# Patient Record
Sex: Female | Born: 1975
Health system: Southern US, Community
[De-identification: ages and names within clinical notes are randomized; demographics above are authoritative.]

## PROBLEM LIST (undated history)

## (undated) DIAGNOSIS — F419 Anxiety disorder, unspecified: Secondary | ICD-10-CM

## (undated) DIAGNOSIS — I10 Essential (primary) hypertension: Secondary | ICD-10-CM

## (undated) DIAGNOSIS — F32A Depression, unspecified: Secondary | ICD-10-CM

## (undated) DIAGNOSIS — F329 Major depressive disorder, single episode, unspecified: Secondary | ICD-10-CM

## (undated) HISTORY — PX: OTHER SURGICAL HISTORY: SHX169

---

## 2007-05-03 ENCOUNTER — Emergency Department: Payer: Self-pay | Admitting: Emergency Medicine

## 2009-09-08 ENCOUNTER — Ambulatory Visit: Payer: Self-pay | Admitting: Family Medicine

## 2010-07-15 HISTORY — PX: TUBAL LIGATION: SHX77

## 2011-03-31 ENCOUNTER — Emergency Department: Payer: Self-pay | Admitting: *Deleted

## 2011-06-10 ENCOUNTER — Inpatient Hospital Stay: Payer: Self-pay | Admitting: Psychiatry

## 2011-06-19 IMAGING — CR DG TOE 5TH LEFT
1 series · 3 of 3 positions shown · non-contrast
Comparison: none

REASON FOR EXAM: injury
COMMENTS:

[Series 2: view not recorded · 0.17mm/px · 3 of 3 slices shown]
[im 1/3]
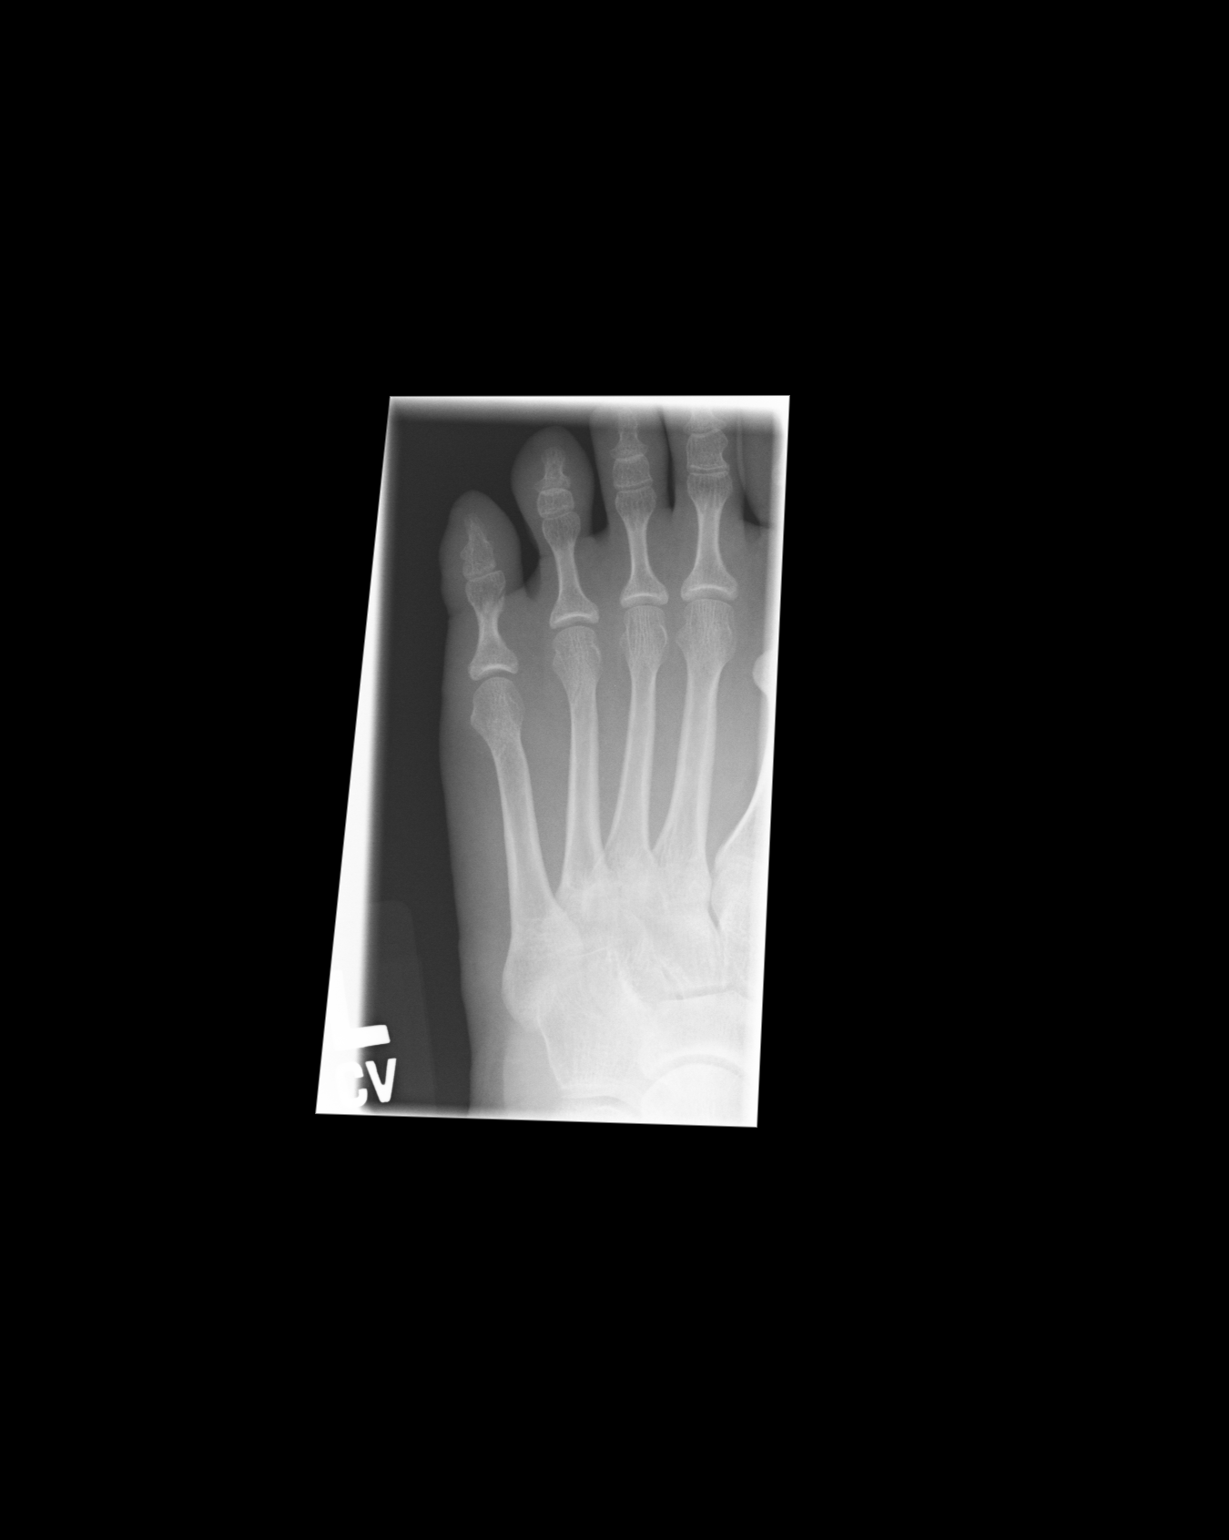
[im 2/3]
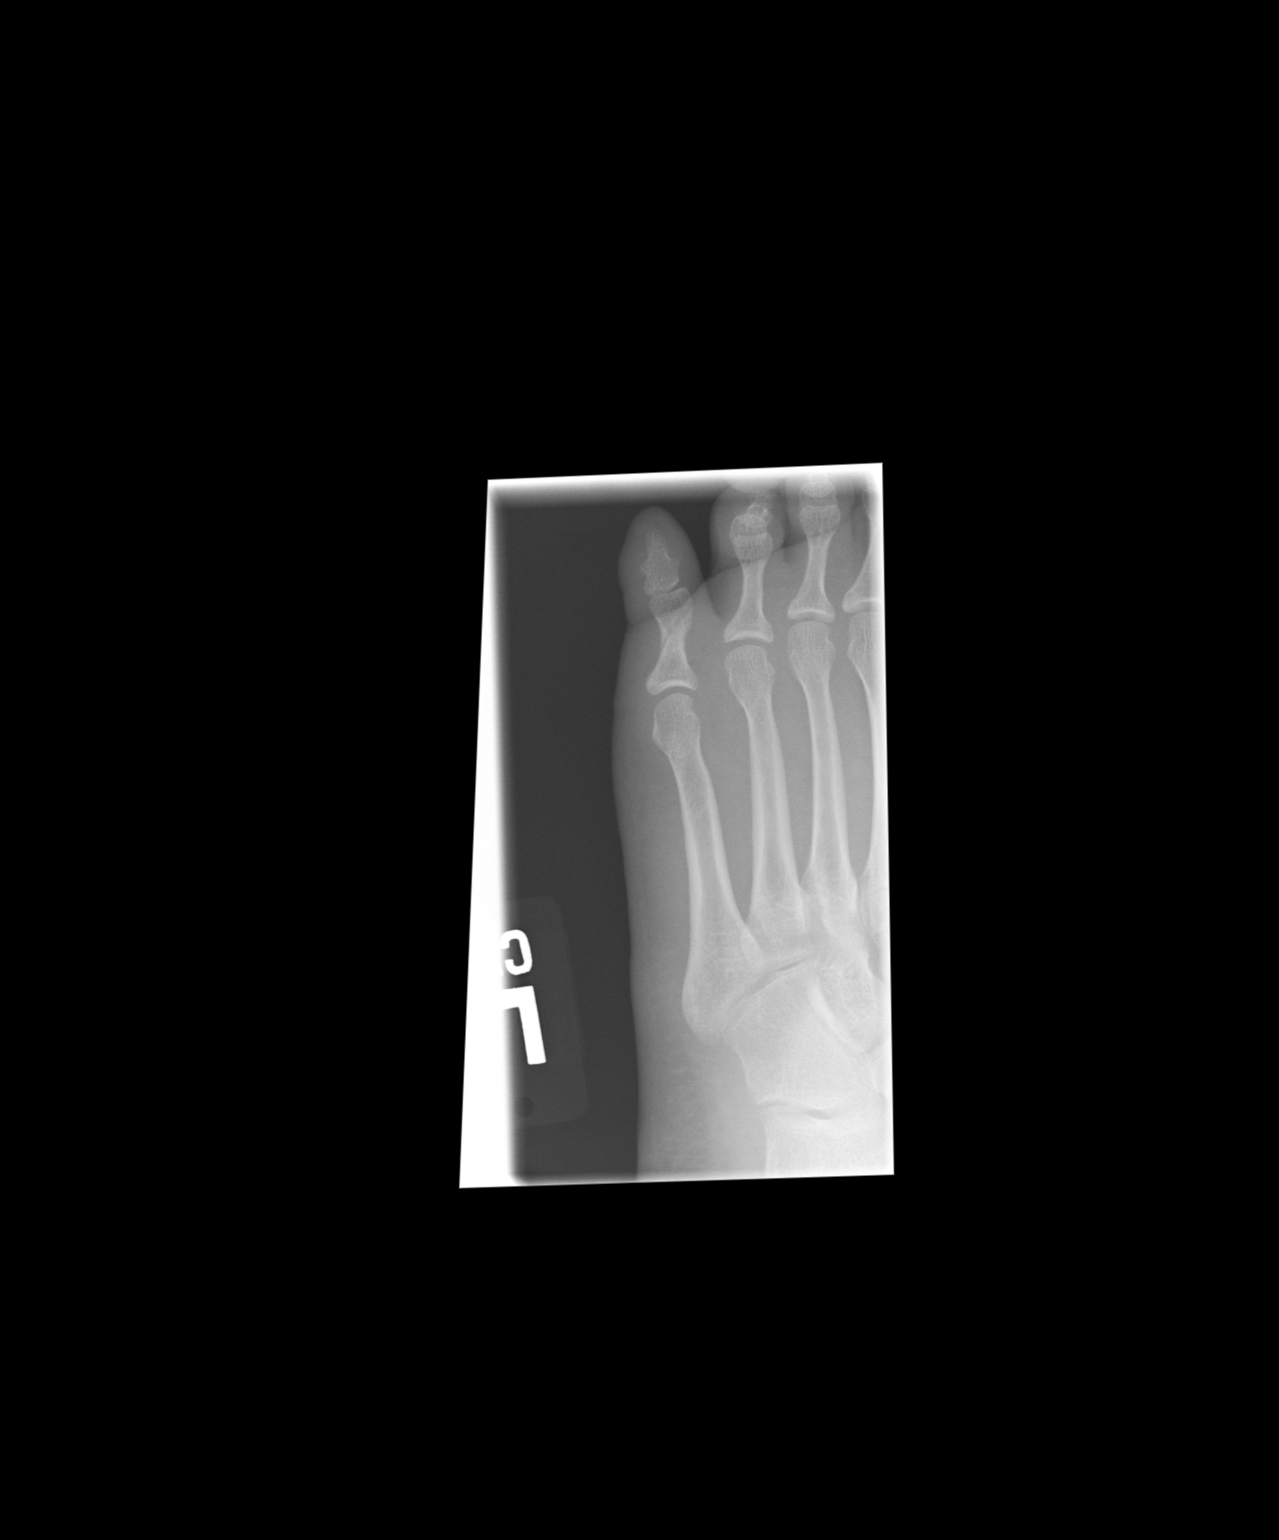
[im 3/3]
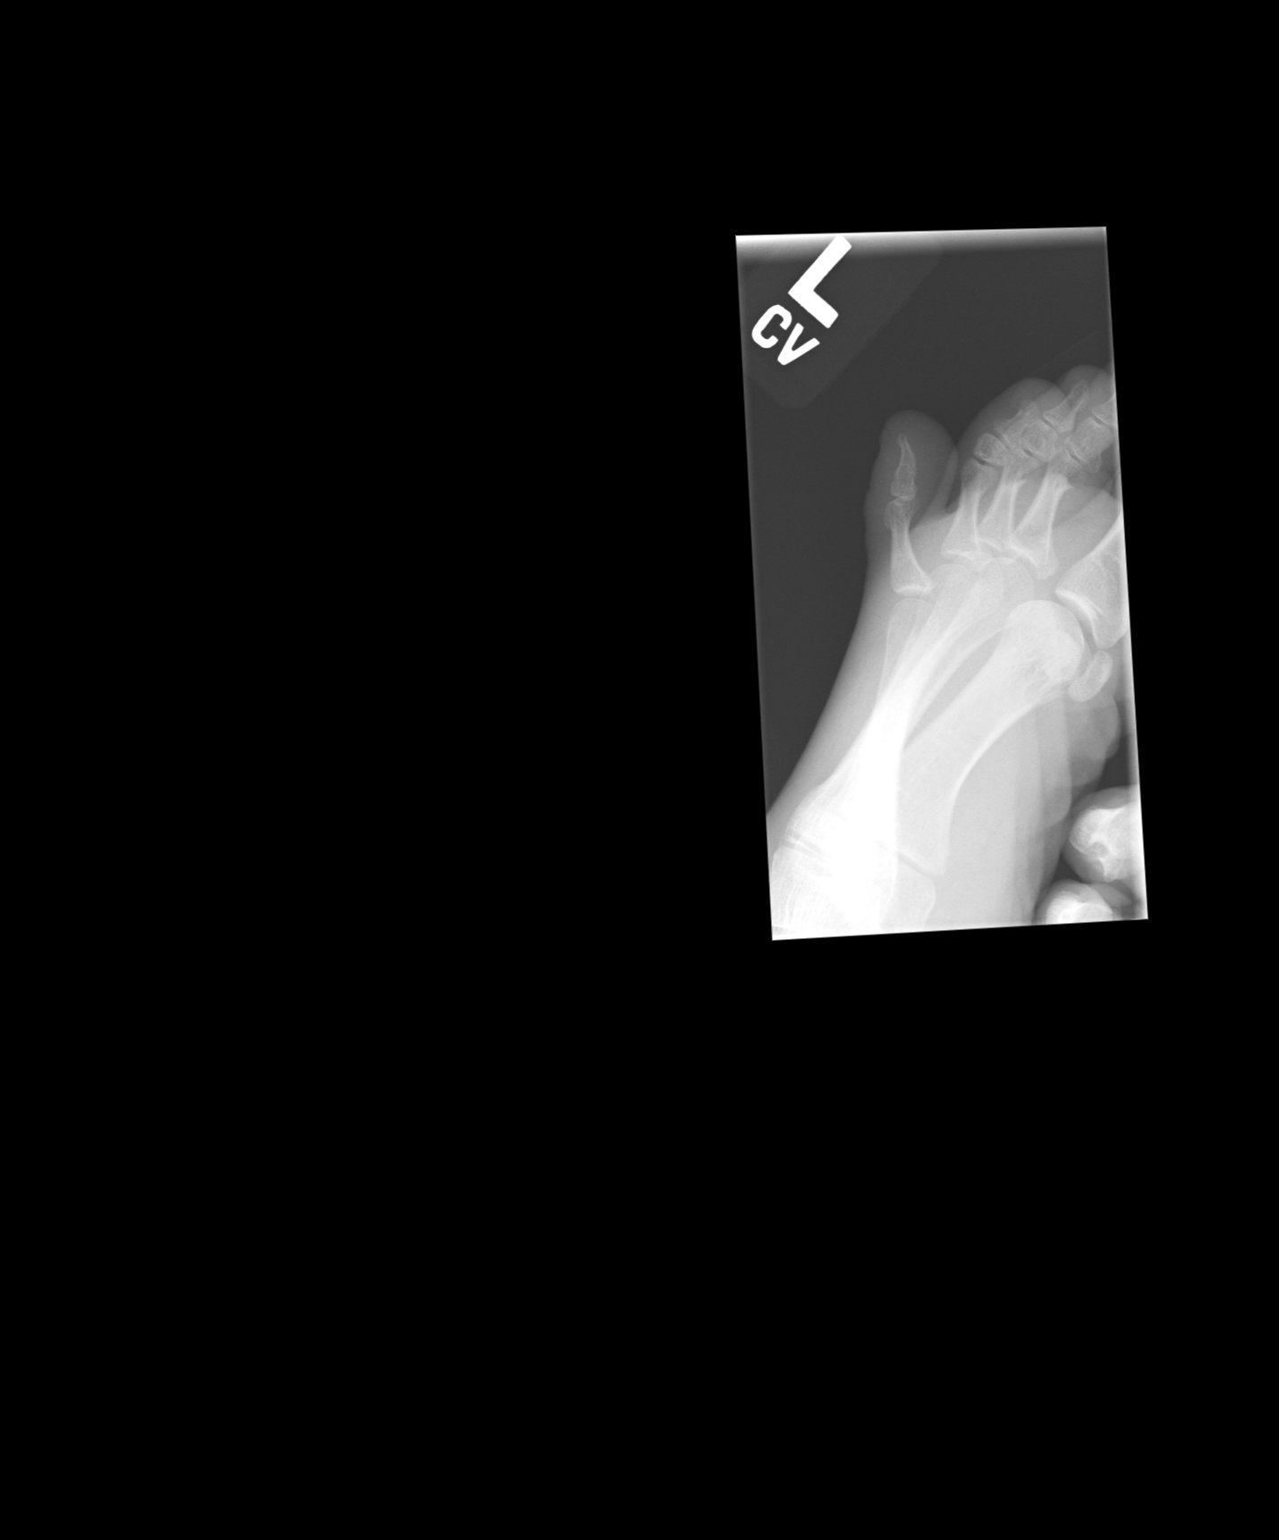

[3 of 3 positions shown; findings below may reference images not displayed]

PROCEDURE:     KDR - KDXR TOE 5TH DIGIT LEFT FOOT  - September 08, 2009  [DATE]

RESULT:     Three views of the fifth toe are submitted. There is a lucency
through the proximal phalanx that is obliquely oriented consistent with an
acute nondisplaced fracture. The distal phalanx is intact. The visualized
portions of the third and fourth digits appear normal. The fifth metatarsal
is intact.
IMPRESSION: The patient has sustained an acute spiral nondisplaced
fracture of the shaft of the proximal phalanx of the left great toe.

## 2011-08-02 ENCOUNTER — Ambulatory Visit: Payer: Self-pay | Admitting: Obstetrics and Gynecology

## 2011-08-02 LAB — HCG, QUANTITATIVE, PREGNANCY: Beta Hcg, Quant.: <1 m[IU]/mL — ABNORMAL LOW

## 2011-08-02 LAB — URINALYSIS, COMPLETE
Bacteria: NONE SEEN
Blood: NEGATIVE
Glucose,UR: NEGATIVE mg/dL (ref 0–75)
Leukocyte Esterase: NEGATIVE
Nitrite: NEGATIVE
Ph: 7 (ref 4.5–8.0)
RBC,UR: <1 /HPF (ref 0–5)

## 2011-08-02 LAB — HEMOGLOBIN: HGB: 13.5 g/dL (ref 12.0–16.0)

## 2011-08-09 ENCOUNTER — Ambulatory Visit: Payer: Self-pay | Admitting: Obstetrics and Gynecology

## 2011-08-09 LAB — PREGNANCY, URINE: Pregnancy Test, Urine: NEGATIVE m[IU]/mL

## 2012-06-04 ENCOUNTER — Emergency Department: Payer: Self-pay | Admitting: Emergency Medicine

## 2012-06-04 LAB — SALICYLATE LEVEL: Salicylates, Serum: 3.7 mg/dL — ABNORMAL HIGH

## 2012-06-04 LAB — TSH: Thyroid Stimulating Horm: 1.24 u[IU]/mL

## 2012-06-04 LAB — CBC
HCT: 43.5 % (ref 35.0–47.0)
HGB: 15 g/dL (ref 12.0–16.0)
MCV: 92 fL (ref 80–100)
RBC: 4.75 10*6/uL (ref 3.80–5.20)
WBC: 11.1 10*3/uL — ABNORMAL HIGH (ref 3.6–11.0)

## 2012-06-04 LAB — URINALYSIS, COMPLETE
Nitrite: NEGATIVE
Protein: 30
RBC,UR: 3 /HPF (ref 0–5)
WBC UR: 26 /HPF (ref 0–5)

## 2012-06-04 LAB — ACETAMINOPHEN LEVEL: Acetaminophen: <2 ug/mL

## 2012-06-04 LAB — DRUG SCREEN, URINE
Barbiturates, Ur Screen: NEGATIVE (ref ?–200)
Cannabinoid 50 Ng, Ur ~~LOC~~: POSITIVE (ref ?–50)
Cocaine Metabolite,Ur ~~LOC~~: POSITIVE (ref ?–300)
MDMA (Ecstasy)Ur Screen: POSITIVE (ref ?–500)
Opiate, Ur Screen: NEGATIVE (ref ?–300)

## 2012-06-04 LAB — COMPREHENSIVE METABOLIC PANEL
Anion Gap: 8 (ref 7–16)
Calcium, Total: 9.2 mg/dL (ref 8.5–10.1)
Chloride: 101 mmol/L (ref 98–107)
Co2: 26 mmol/L (ref 21–32)
EGFR (African American): >60
Potassium: 3.3 mmol/L — ABNORMAL LOW (ref 3.5–5.1)
SGOT(AST): 27 U/L (ref 15–37)

## 2012-06-04 LAB — ETHANOL
Ethanol %: <0.003 % (ref 0.000–0.080)
Ethanol: <3 mg/dL

## 2012-07-24 ENCOUNTER — Ambulatory Visit: Payer: Self-pay | Admitting: Family Medicine

## 2013-01-08 IMAGING — US US PELV - US TRANSVAGINAL
1 series · 17 of 25 positions shown · non-contrast
Comparison: none

REASON FOR EXAM: vaginal bleeding
COMMENTS:

PROCEDURE:     US  - US PELVIS EXAM W/TRANSVAGINAL  - March 31, 2011  [DATE]
RESULT:     Though ultrasound dated 03/31/2011.
Transabdominal and endovaginal imaging of the pelvis was obtained.

[Series 1: us pelv - us transvaginal · 17 of 77 slices shown]
[im 1/77]
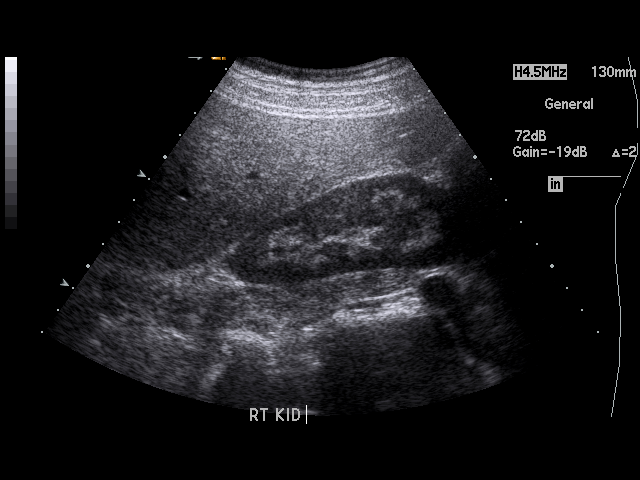
[im 7/77]
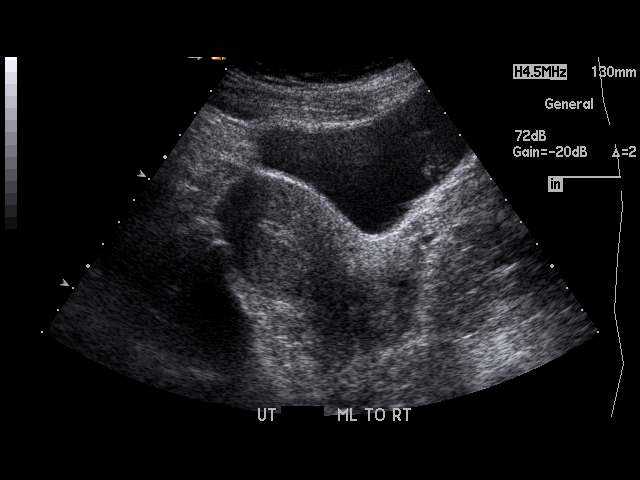
[im 10/77]
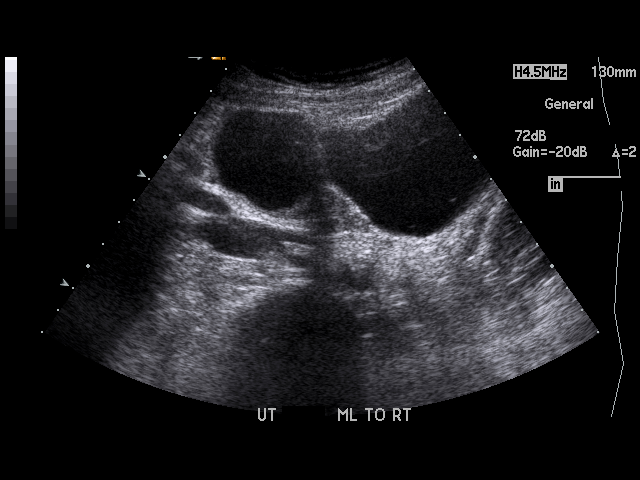
[im 16/77]
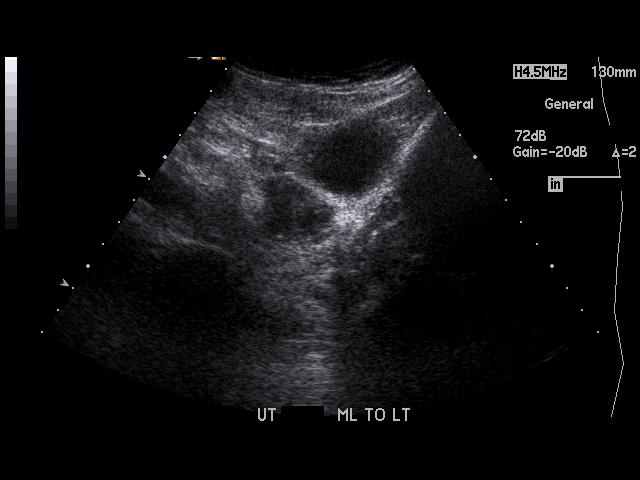
[im 20/77]
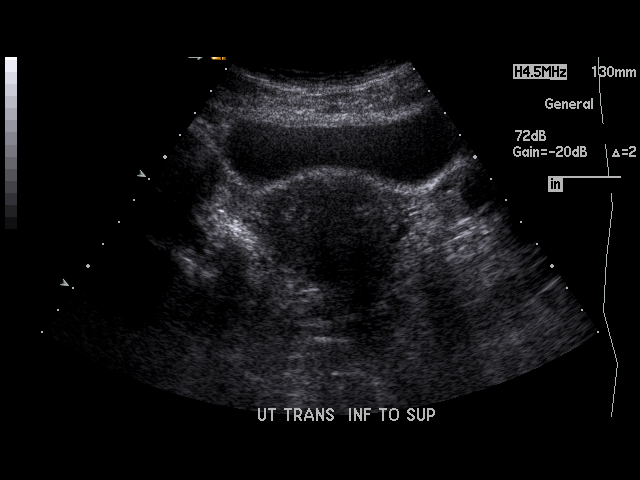
[im 26/77]
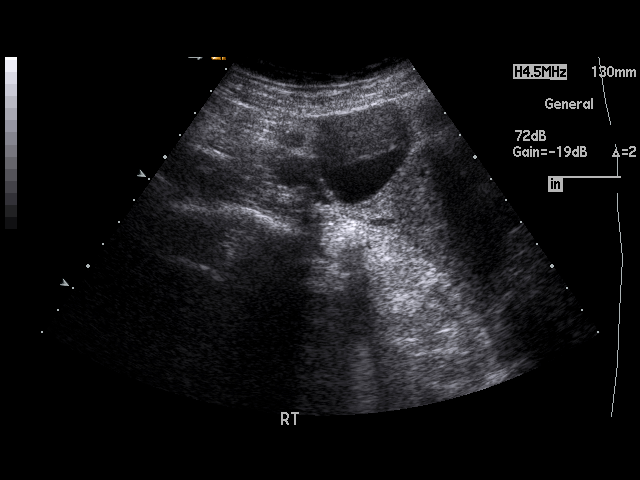
[im 29/77]
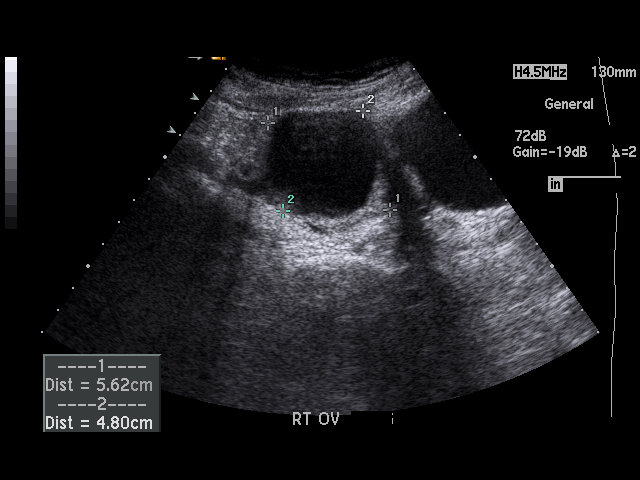
[im 35/77]
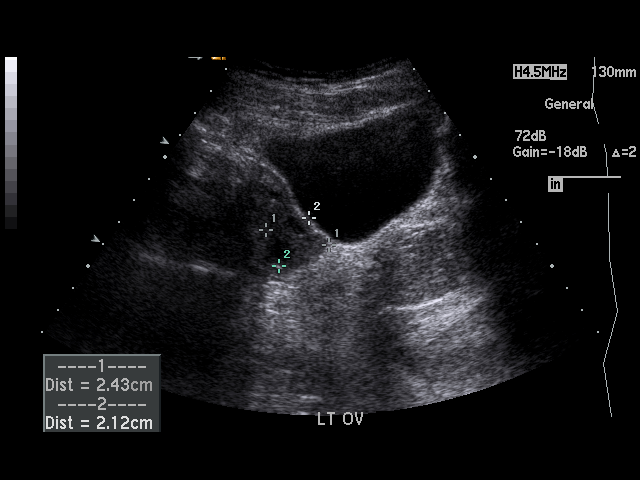
[im 39/77]
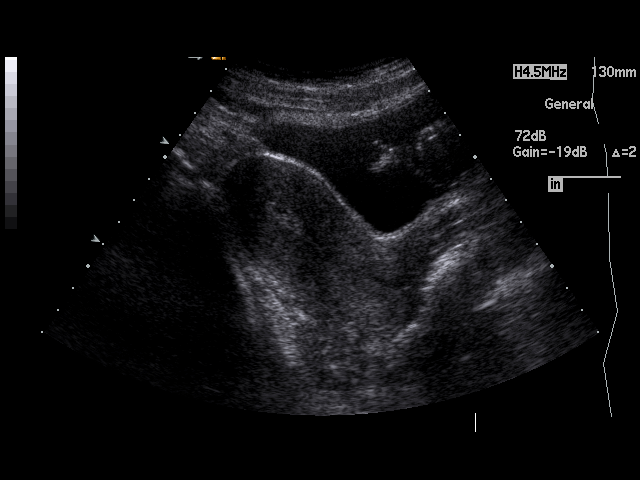
[im 42/77]
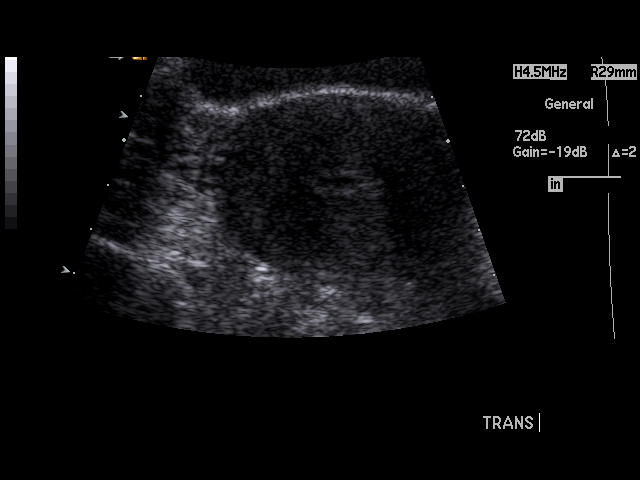
[im 48/77]
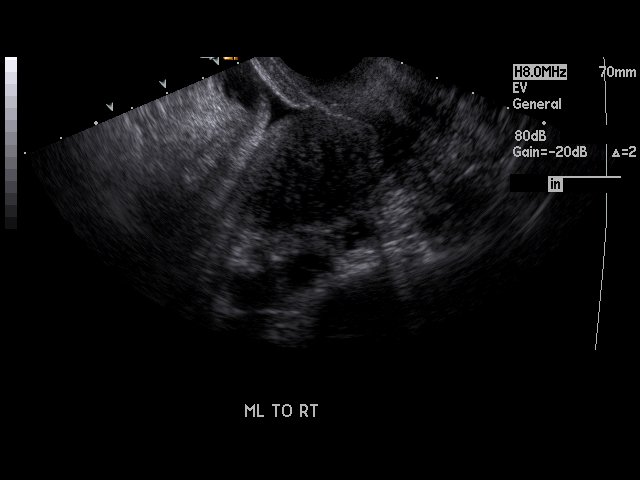
[im 51/77]
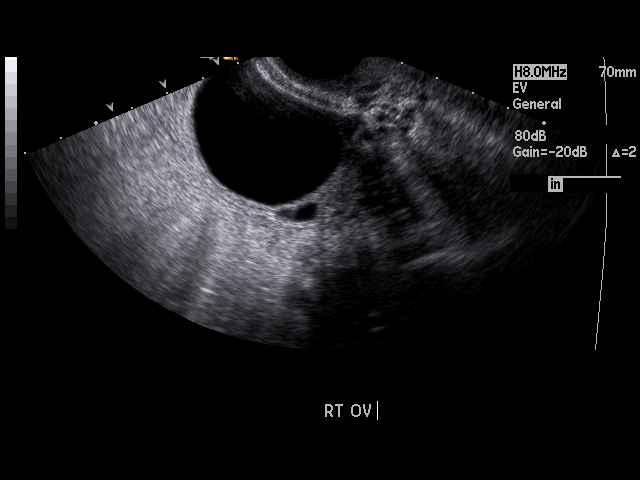
[im 58/77]
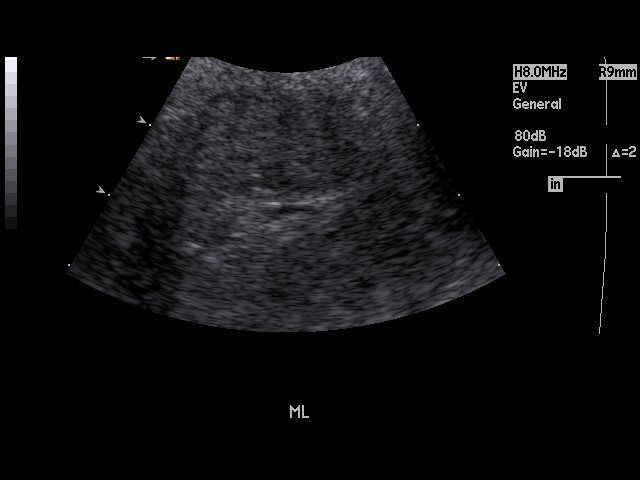
[im 61/77]
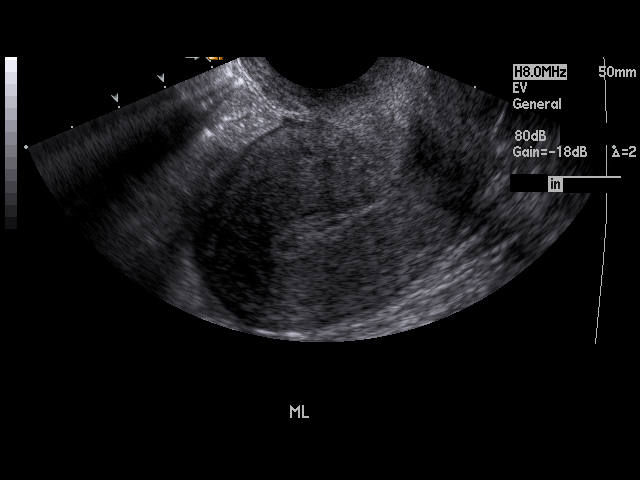
[im 67/77]
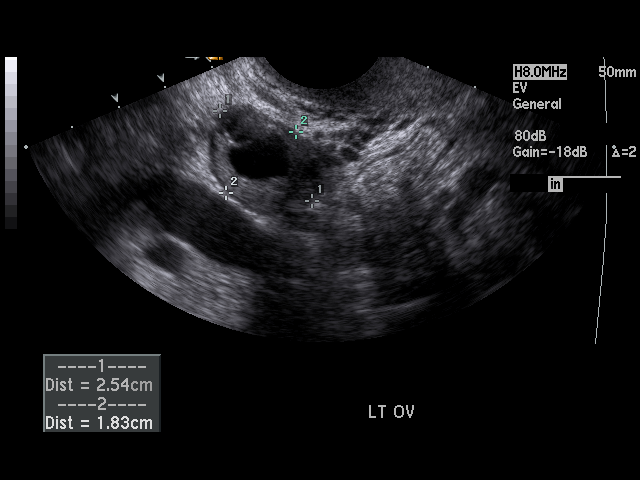
[im 70/77]
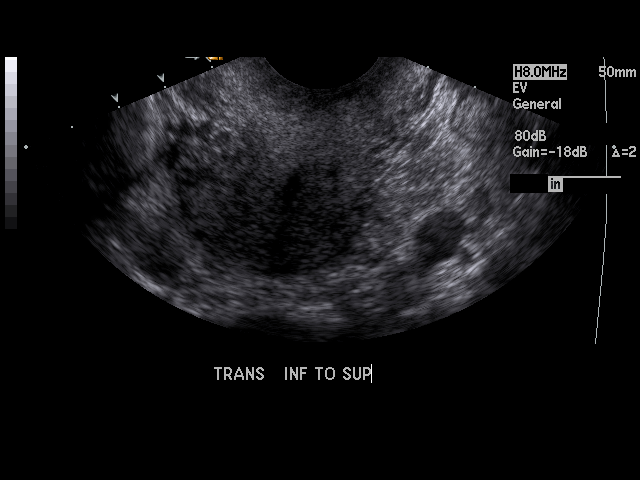
[im 77/77]
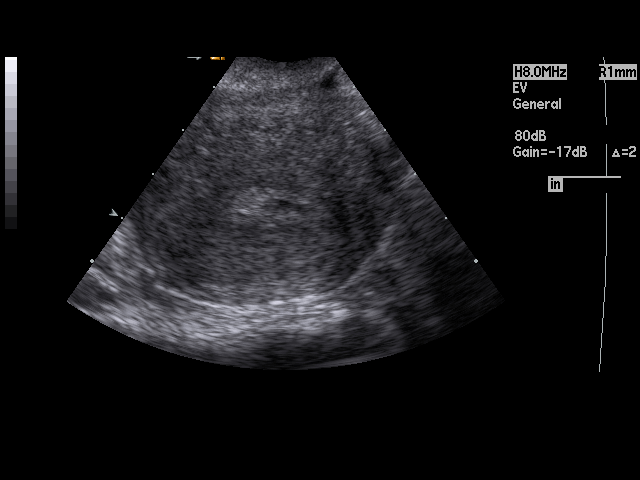

[17 of 25 positions shown; findings below may reference images not displayed]

FINDINGS: The uterus demonstrates a homogeneous echotexture. Endometrial
thickness is 4.2 mm. The right ovary measures 5.71 x 4.3 to 4.75 cm. A right
ovarian cyst is appreciated measuring 3.9 x 3.21 a 4.3 cm. The left ovary
measures 2.5 by 1.8 x 2.3 cm. The ovaries otherwise demonstrate a
homogeneous echotexture color flow is appreciated within the right left
ovaries as well as arterial and venous waveforms.
IMPRESSION: Cyst involving the right ovary otherwise unremarkable
pelvic ultrasound.

## 2014-05-04 IMAGING — CT CT STONE STUDY
2 series · 12 of 16 positions shown, 15 images · non-contrast
Comparison: none

REASON FOR EXAM: CR [id number]   Eval for Stones back pain Hematuria
COMMENTS:

PROCEDURE:     CT  - CT ABDOMEN /PELVIS WO (STONE)  - July 24, 2012 [DATE]
RESULT:     CT abdomen pelvis dated 08/03/1912
TECHNIQUE: Helical noncontrasted 3 mm sections were obtained from lung base
through the pubic symphysis.

[Series 2: soft tissue · axial · 0.66mm/px · z∈[-418,-86]mm · 10 of 137 slices shown, 13 images]
[im 13/137  soft-tissue]
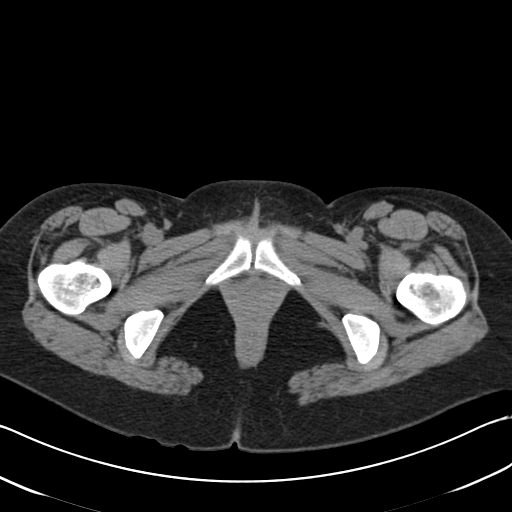
[im 13/137  bone]
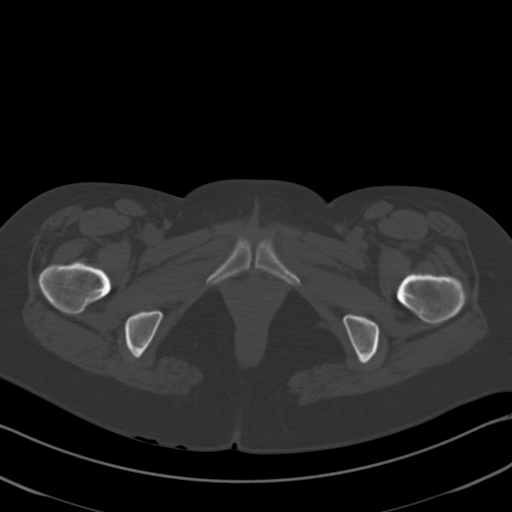
[im 25/137  soft-tissue]
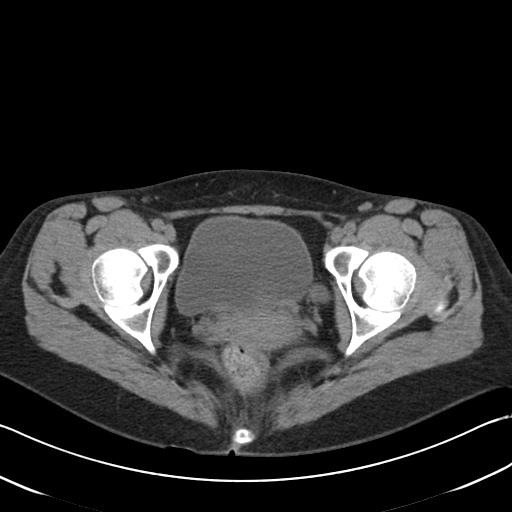
[im 38/137  soft-tissue]
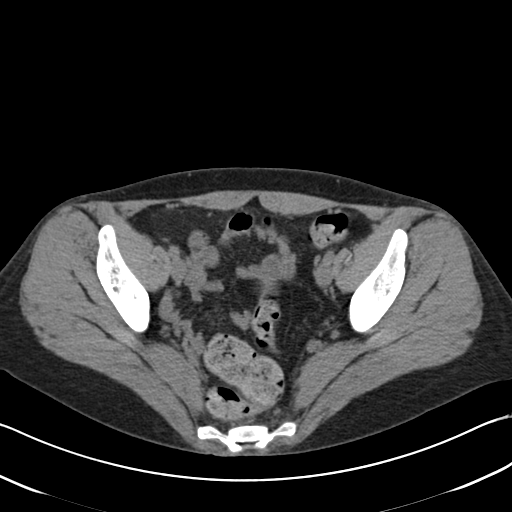
[im 50/137  soft-tissue]
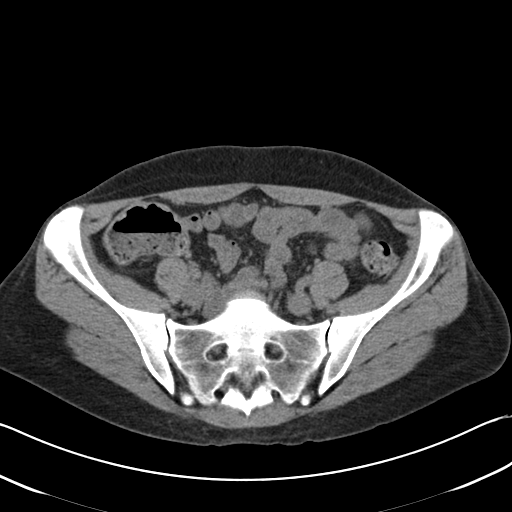
[im 62/137  soft-tissue]
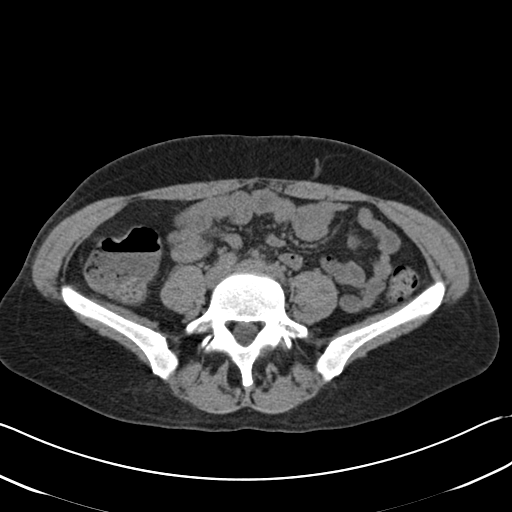
[im 62/137  bone]
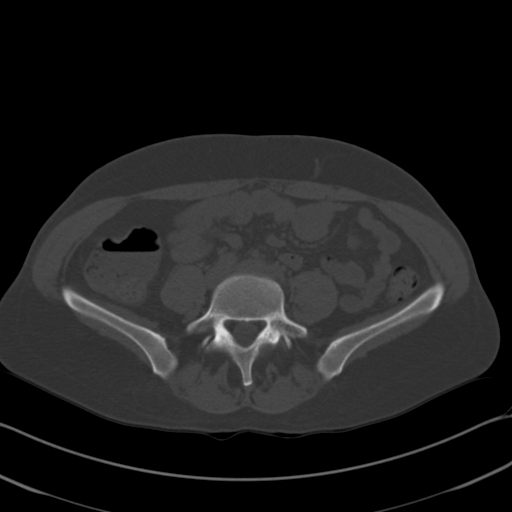
[im 75/137  soft-tissue]
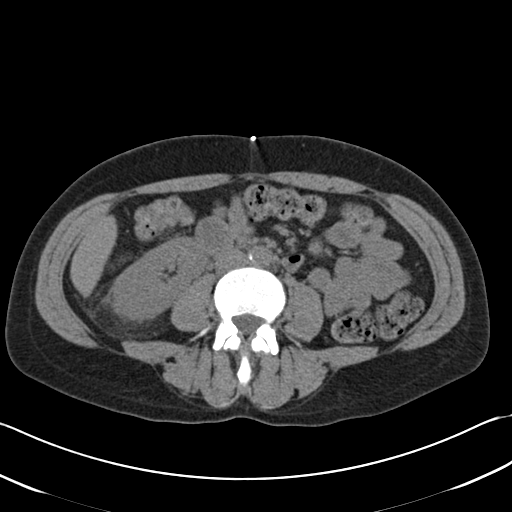
[im 87/137  soft-tissue]
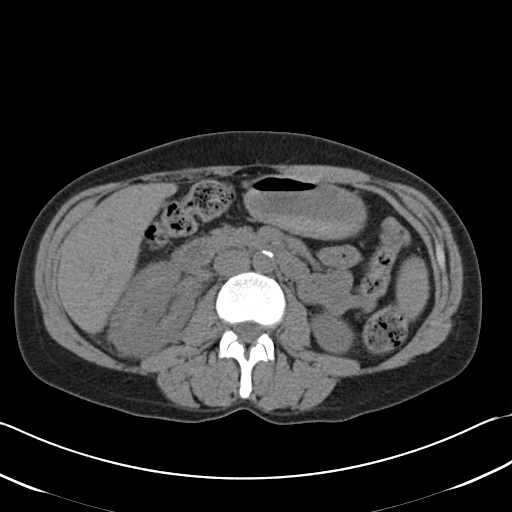
[im 99/137  soft-tissue]
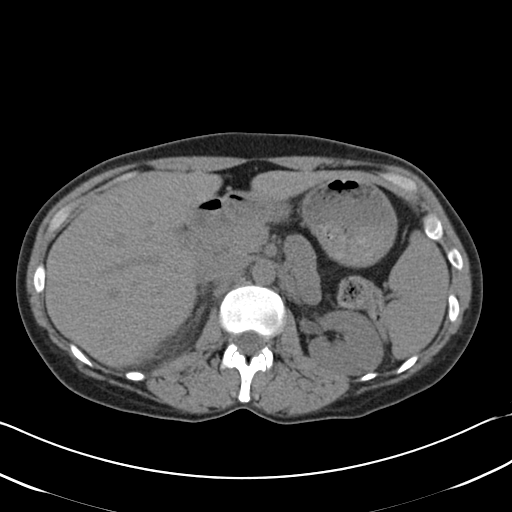
[im 112/137  soft-tissue]
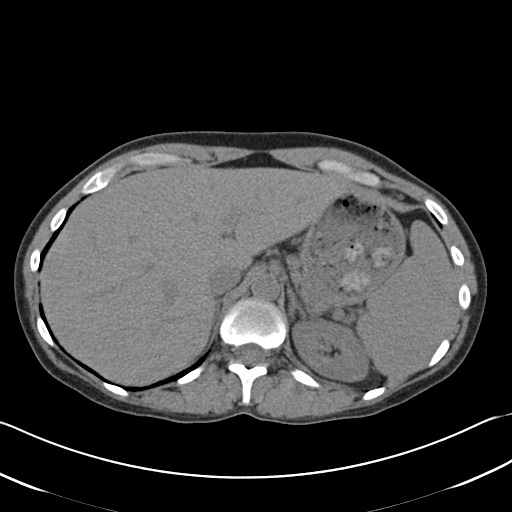
[im 112/137  bone]
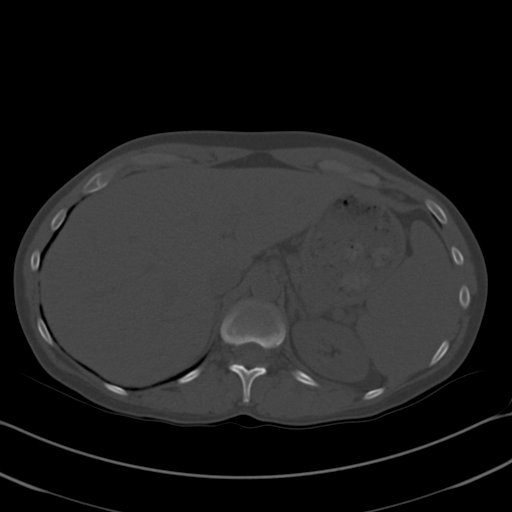
[im 124/137  soft-tissue]
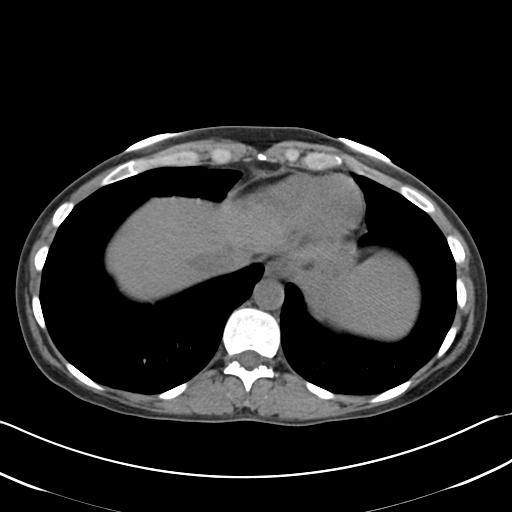

[Series 4: lung · axial · 0.65mm/px · z∈[-134,-92]mm · 2 of 44 slices shown]
[im 15/44  bone]
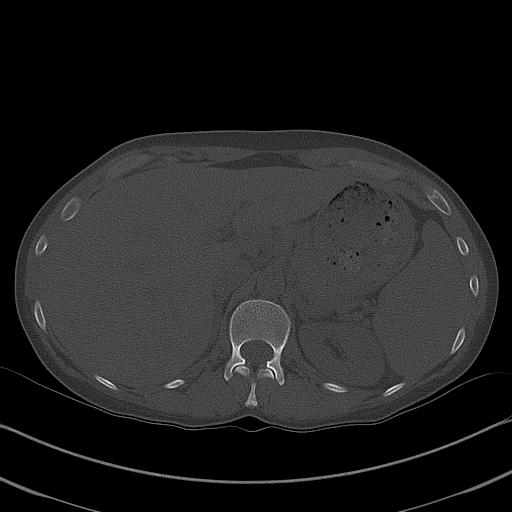
[im 29/44  bone]
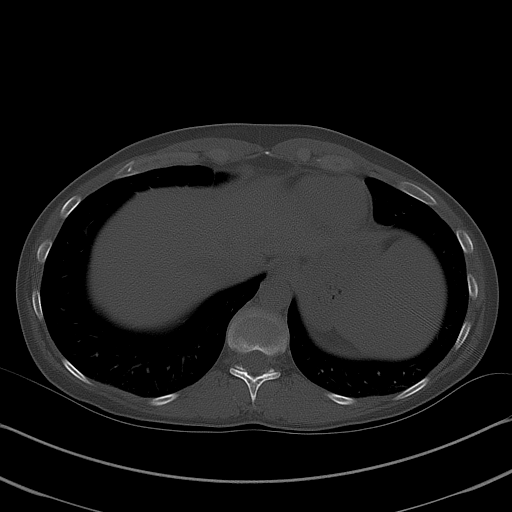

[12 of 16 positions shown; findings below may reference images not displayed]

FINDINGS: The lung bases are unremarkable.

The right kidney appears edematous there is stranding within the surrounding
perinephric fat. A nonobstructing 4.5 mm medullary calculus of the posterior
midpole of the kidney. There is no caliectasis without evidence of
hydroureter. The left kidney and ureter are unremarkable.

Noncontrast evaluation of the liver, spleen, adrenals, pancreas is
unremarkable. There is no CT evidence of bowel obstruction, enteritis,
colitis, diverticulitis within the limitations of a noncontrasted CT. The
appendix is identified and is unremarkable. There is no evidence of
abdominal aortic aneurysm.
IMPRESSION: Findings like reflecting inflammatory changes within the
right kidney. These findings may represent the sequela of a prior calculus
passing through the system versus pyelonephritis or possibly a combination.
2. Nonobstructing medullary calculus on the right

## 2014-09-27 ENCOUNTER — Ambulatory Visit: Payer: Self-pay | Admitting: Family Medicine

## 2014-10-09 ENCOUNTER — Emergency Department: Payer: Self-pay | Admitting: Emergency Medicine
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
## 2014-11-01 NOTE — Consult Note (Signed)
Brief Consult Note: Diagnosis: anxiety disorder nos.   Patient was seen by consultant.   Consult note dictated.   Discussed with Attending MD.   Comments: Psychiatry: Patient seen. Note done. Patient denies SI and is calm and stable and not acutely dangerous. Wants to go home. Has outpt mental health appoitment tomorrow. Reviewed with Dr Mayford KnifeWilliams. Will cancel admit orders as being premature and in error.  Electronic Signatures: Audery Amellapacs, John T (MD)  (Signed 21-Nov-13 12:53)  Authored: Brief Consult Note   Last Updated: 21-Nov-13 12:53 by Audery Amellapacs, John T (MD)

## 2014-11-06 NOTE — Op Note (Signed)
PATIENT NAME:  Desiree HumphreyJOPLIN, Storey E MR#:  [id number] DATE OF BIRTH:  November 06, 1975  DATE OF PROCEDURE:  08/09/2011  PREOPERATIVE DIAGNOSES: 1. Menometrorrhagia. 2. Desires permanent sterilization.   POSTOPERATIVE DIAGNOSES:  1. Menometrorrhagia. 2. Desires permanent sterilization.   PROCEDURES:  1. Laparoscopic tubal cautery. 2. NovaSure endometrial ablation.   SURGEON: Ricky L. Logan BoresEvans, MD  ASSISTANT: Scrub tech.   ANESTHESIA: General orotracheal.   FINDINGS: Grossly normal hysteroscopy. Grossly normal tubes, ovaries, uterus and lower abdomen.    ESTIMATED BLOOD LOSS: Minimal.   COMPLICATIONS: None.   SPECIMENS: Endometrial curettings.   DRAINS: In and out catheter with red rubber prior to laparoscopy of 375 mL of clear urine.   NOTE: Patient is a 39 year old parous white female who came with heavy prolonged bleeding and desired permanent sterilization. Of note, she had some hesitation this week when she learned that the NovaSure ablation would make it impossible for her to carry children in the future. We discussed with her that that is what the tubal was planned to do as well and advised her to think about it and make sure it is what she wanted to do. She thought about and then again stated during the week over the phone and then this morning that it is what she wants to do and she is accepting of the fact that she will not carry more children, does not want more children.   DESCRIPTION OF PROCEDURE: After receiving preoperative antibiotics, taken to the Operating Room, placed in supine position, where anesthesia was obtained and placed in dorsal lithotomy position using Allen stirrups, prepped and draped in usual sterile fashion. Cervix was visualized, grasped with single-tooth tenaculum. Measurements taken. Dilated up. Hysteroscope with findings as noted above and sharp curette was used to obtain endometrial curettings from the four quadrants.   Device was placed and set to cavity depth  of 5 cm, (sounding length 8cm minus cervical length of 3) and cycle was carried out at a power of 83 over a minute and 40 seconds.    Before and after pictures were taken with hysteroscope. Next we drained the bladder. Attention was turned to the abdomen.   10 mL of 0.5% Sensorcaine was injected infraumbilically. A #15 blade was used to make approximately 1 cm vertical infraumbilical incision through which an 11 port was placed and pneumoperitoneum was established. Patient was placed in Trendelenburg position with findings as noted above and proceeded with laparoscopic tubal cautery in  relatively avascular region in the mid ampullary region bilateral fallopian tubes in the usual fashion. Pressure was lowered to 6 mmHg. Areas were seen to be hemostatic. Ports removed. Pneumoperitoneum was allowed to resolve. Incisions closed with deep of 0, subcutaneous with 3-0 Vicryl. Steri-Strips and Band-Aids were placed.   All instrument, needle, and sponge counts were correct. Patient tolerated procedure well. Anticipate routine postoperative course.   She received 1 gram Ancef preoperatively.   ____________________________ Reatha Harpsicky L. Logan BoresEvans, MD rle:cms D: 08/09/2011 08:13:09 ET T: 08/09/2011 08:57:10 ET JOB#: [id number]  cc: Ricky L. Logan BoresEvans, MD, <Dictator> Augustina MoodICK L Garrie Woodin MD ELECTRONICALLY SIGNED 08/12/2011 7:26

## 2014-12-28 DIAGNOSIS — F319 Bipolar disorder, unspecified: Secondary | ICD-10-CM | POA: Insufficient documentation

## 2014-12-28 DIAGNOSIS — F329 Major depressive disorder, single episode, unspecified: Secondary | ICD-10-CM | POA: Insufficient documentation

## 2014-12-28 DIAGNOSIS — F32A Depression, unspecified: Secondary | ICD-10-CM | POA: Insufficient documentation

## 2014-12-28 DIAGNOSIS — I10 Essential (primary) hypertension: Secondary | ICD-10-CM | POA: Insufficient documentation

## 2014-12-28 DIAGNOSIS — R87610 Atypical squamous cells of undetermined significance on cytologic smear of cervix (ASC-US): Secondary | ICD-10-CM | POA: Insufficient documentation

## 2014-12-28 DIAGNOSIS — Z87442 Personal history of urinary calculi: Secondary | ICD-10-CM | POA: Insufficient documentation

## 2014-12-29 ENCOUNTER — Ambulatory Visit: Payer: Self-pay | Admitting: Family Medicine

## 2014-12-30 ENCOUNTER — Ambulatory Visit: Payer: Self-pay | Admitting: Family Medicine

## 2015-06-29 ENCOUNTER — Other Ambulatory Visit: Payer: Self-pay | Admitting: Family Medicine

## 2015-07-26 DIAGNOSIS — N879 Dysplasia of cervix uteri, unspecified: Secondary | ICD-10-CM | POA: Insufficient documentation

## 2015-07-26 DIAGNOSIS — R87619 Unspecified abnormal cytological findings in specimens from cervix uteri: Secondary | ICD-10-CM | POA: Insufficient documentation

## 2015-11-17 ENCOUNTER — Ambulatory Visit: Payer: Self-pay | Admitting: Family Medicine

## 2015-11-17 ENCOUNTER — Emergency Department: Payer: BLUE CROSS/BLUE SHIELD

## 2015-11-17 ENCOUNTER — Telehealth: Payer: Self-pay

## 2015-11-17 ENCOUNTER — Encounter: Payer: Self-pay | Admitting: Emergency Medicine

## 2015-11-17 ENCOUNTER — Emergency Department
Admission: EM | Admit: 2015-11-17 | Discharge: 2015-11-17 | Disposition: A | Discharge status: Home / Self Care | Payer: BLUE CROSS/BLUE SHIELD | Attending: Emergency Medicine | Admitting: Emergency Medicine

## 2015-11-17 DIAGNOSIS — F17210 Nicotine dependence, cigarettes, uncomplicated: Secondary | ICD-10-CM | POA: Diagnosis not present

## 2015-11-17 DIAGNOSIS — F329 Major depressive disorder, single episode, unspecified: Secondary | ICD-10-CM | POA: Diagnosis not present

## 2015-11-17 DIAGNOSIS — I10 Essential (primary) hypertension: Secondary | ICD-10-CM | POA: Insufficient documentation

## 2015-11-17 DIAGNOSIS — R0789 Other chest pain: Secondary | ICD-10-CM | POA: Insufficient documentation

## 2015-11-17 HISTORY — DX: Major depressive disorder, single episode, unspecified: F32.9

## 2015-11-17 HISTORY — DX: Essential (primary) hypertension: I10

## 2015-11-17 HISTORY — DX: Anxiety disorder, unspecified: F41.9

## 2015-11-17 HISTORY — DX: Depression, unspecified: F32.A

## 2015-11-17 LAB — CBC
HEMATOCRIT: 39.7 % (ref 35.0–47.0)
HEMOGLOBIN: 13.7 g/dL (ref 12.0–16.0)
MCH: 31.7 pg (ref 26.0–34.0)
MCHC: 34.6 g/dL (ref 32.0–36.0)
MCV: 91.7 fL (ref 80.0–100.0)
Platelets: 171 10*3/uL (ref 150–440)
RBC: 4.33 MIL/uL (ref 3.80–5.20)
RDW: 13.6 % (ref 11.5–14.5)
WBC: 5.7 10*3/uL (ref 3.6–11.0)

## 2015-11-17 LAB — BASIC METABOLIC PANEL
ANION GAP: 8 (ref 5–15)
CHLORIDE: 103 mmol/L (ref 101–111)
CO2: 27 mmol/L (ref 22–32)
Calcium: 9.2 mg/dL (ref 8.9–10.3)
Creatinine, Ser: 0.82 mg/dL (ref 0.44–1.00)
GFR calc Af Amer: >60 mL/min (ref >60)
GFR calc non Af Amer: >60 mL/min (ref >60)
GLUCOSE: 91 mg/dL (ref 65–99)
POTASSIUM: 3.3 mmol/L — AB (ref 3.5–5.1)
Sodium: 138 mmol/L (ref 135–145)

## 2015-11-17 LAB — TROPONIN I: Troponin I: <0.03 ng/mL (ref <0.031)

## 2015-11-17 NOTE — Telephone Encounter (Signed)
Patient called saying that she has had swelling in her left arm for the last 3 days. She denies any redness, numbness, or tingling. Patient reports that her hand is so swollen that she is unable to make a fist. Patient reports that this morning around 7:30 she now has associated chest pain. She denies shortness of breath or dizziness. She does mention that after taking her BP mediation this morning the swelling went down a little, but it is still present. Advised patient that per Nadine CountsBob, she should go to the ER for evaluation. Patient reports that she has a new [employer] technologistinsurance policy and does not know if she will be able to pay co-pay at the hospital vs. Co-pay at our office. Patient reports that she will go to the hospital, but she will keep her appt this afternoon just in case she can not afford to be seen.

## 2015-11-17 NOTE — Discharge Instructions (Signed)

## 2015-11-17 NOTE — ED Notes (Signed)
Pt states she woke up this morning with her left arm swelling and feeling tight.  She also endorses R chest tightness that she has experienced before with her anxiety.  Pt states she recently had a few days of fatigue, dizziness, headache and possible fever.  Denies shortness of breath and appears to be in no distress.

## 2015-11-17 NOTE — ED Provider Notes (Signed)
[facility] Emergency Department Provider Note  ____________________________________________    I have reviewed the triage vital signs and the nursing notes.   HISTORY  Chief Complaint Arm Pain and Chest Pain    HPI Desiree [name] is a 40 y.o. female who presents with complaints of left arm swelling each of the past 3 mornings. Patient reports she would wake up and feel that her left arm was swollen and felt somewhat tight. She had no pain. She denies injury to the area. Today she also felt some right chest tightness as well. She called her doctor's office and after she mentioned the chest tightness that told her to come to the emergency department. Currently she feels well. She denies shortness of breath. She denies fever to me. No history of the same.     Past Medical History  Diagnosis Date  . Hypertension   . Anxiety   . Depression     Patient Active Problem List   Diagnosis Date Noted  . Clinical depression 12/28/2014  . Atypical squamous cells of undetermined significance (ASC-US) on cervical Pap smear 12/28/2014  . Affective bipolar disorder (HCC) 12/28/2014  . H/O renal calculi 12/28/2014  . Essential (primary) hypertension 12/28/2014    Past Surgical History  Procedure Laterality Date  . Tubal ligation  07/2010    WITH ABLATION  . Uterine ablation      Current Outpatient Rx  Name  Route  Sig  Dispense  Refill  . Fluoxetine HCl, PMDD, 20 MG TABS   Oral   Take 1 tablet by mouth daily.         . hydrochlorothiazide (HYDRODIURIL) 25 MG tablet      TAKE 1 TABLET BY MOUTH EVERY DAY   90 tablet   3   . MULTIPLE VITAMIN PO   Oral   Take 1 tablet by mouth daily.         . QUEtiapine (SEROQUEL) 200 MG tablet   Oral   Take 1 tablet by mouth daily.         . traZODone (DESYREL) 50 MG tablet   Oral   Take 1-2 tablets by mouth at bedtime.           Allergies Venlafaxine  Family History  Problem Relation Age of Onset   . Diabetes Mother   . Coronary artery disease Mother   . Heart disease Mother   . Asthma Father     Social History Social History  Substance Use Topics  . Smoking status: Current Some Day Smoker -- 0.50 packs/day for 20 years    Types: Cigarettes  . Smokeless tobacco: Never Used  . Alcohol Use: No     Comment: OCCASIONALLY    Review of Systems  Constitutional: Negative for fever. Eyes: Negative for redness ENT: Negative for sore throat Cardiovascular: As above Respiratory: Negative for shortness of breath. Gastrointestinal: Negative for abdominal pain Genitourinary: Negative for dysuria. Musculoskeletal: Negative for back pain. Arm swelling Skin: Negative for rash. Neurological: Negative for focal weakness headache Psychiatric: no anxiety    ____________________________________________   PHYSICAL EXAM:  VITAL SIGNS: ED Triage Vitals  Enc Vitals Group     BP 11/17/15 1026 118/74 mmHg     Pulse Rate 11/17/15 1026 64     Resp 11/17/15 1026 16     Temp 11/17/15 1026 98.3 F (36.8 C)     Temp Source 11/17/15 1026 Oral     SpO2 11/17/15 1026 98 %  Weight 11/17/15 1026 120 lb (54.432 kg)     Height 11/17/15 1026 5\' 2"  (1.575 m)     Head Cir --      Peak Flow --      Pain Score 11/17/15 1027 5     Pain Loc --      Pain Edu? --      Excl. in GC? --     Constitutional: Alert and oriented. Well appearing and in no distress.  Eyes: Conjunctivae are normal. No erythema or injection ENT   Head: Normocephalic and atraumatic.   Mouth/Throat: Mucous membranes are moist. Cardiovascular: Normal rate, regular rhythm. Normal and symmetric distal pulses are present in the upper extremities.  Respiratory: Normal respiratory effort without tachypnea nor retractions. Breath sounds are clear and equal bilaterally.  Gastrointestinal: Soft and non-tender in all quadrants. No distention. There is no CVA tenderness. Genitourinary: deferred Musculoskeletal: Nontender  with normal range of motion in all extremities. No other x-rays swelling. 2+ distal pulses. Warm and well perfused. Compartments are soft. Full range of motion No lower extremity tenderness nor edema. Neurologic:  Normal speech and language. No gross focal neurologic deficits are appreciated. Skin:  Skin is warm, dry and intact. No rash noted. Psychiatric: Mood and affect are normal. Patient exhibits appropriate insight and judgment.  ____________________________________________    LABS (pertinent positives/negatives)  Labs Reviewed  BASIC METABOLIC PANEL - Abnormal; Notable for the following:    Potassium 3.3 (*)    BUN <5 (*)    All other components within normal limits  CBC  TROPONIN I    ____________________________________________   EKG  ED ECG REPORT I, Jene EveryKINNER, [place], the attending physician, personally viewed and interpreted this ECG.  Date: 11/17/2015 EKG Time: 10:18 AM Rate: 62 Rhythm: normal sinus rhythm QRS Axis: normal Intervals: normal ST/T Wave abnormalities: normal Conduction Disturbances: none Narrative Interpretation: unremarkable   ____________________________________________    RADIOLOGY  Chest x-ray normal  ____________________________________________   PROCEDURES  Procedure(s) performed: none  Critical Care performed:none  ____________________________________________   INITIAL IMPRESSION / ASSESSMENT AND PLAN / ED COURSE  Pertinent labs & imaging results that were available during my care of the patient were reviewed by me and considered in my medical decision making (see chart for details).  Patient well-appearing and in no distress. There is no swelling of her left arm currently. Lab work is unremarkable. EKG is normal. Chest x-ray is benign. Offered ultrasound of the upper extremity to the patient but she declined as she is impatient to get home. She will follow-up with her PCP for outpatient ultrasound. She does return if any  worsening of her symptoms.  ____________________________________________   FINAL CLINICAL IMPRESSION(S) / ED DIAGNOSES  Final diagnoses:  Atypical chest pain          Jene Everyobert Klayten Jolliff, MD 11/17/15 2106

## 2015-11-20 ENCOUNTER — Ambulatory Visit (INDEPENDENT_AMBULATORY_CARE_PROVIDER_SITE_OTHER): Payer: BLUE CROSS/BLUE SHIELD | Admitting: Family Medicine

## 2015-11-20 ENCOUNTER — Encounter: Payer: Self-pay | Admitting: Family Medicine

## 2015-11-20 VITALS — BP 110/62 | HR 60 | Temp 98.5°F | Resp 16 | Wt 122.2 lb

## 2015-11-20 DIAGNOSIS — R600 Localized edema: Secondary | ICD-10-CM

## 2015-11-20 DIAGNOSIS — R609 Edema, unspecified: Secondary | ICD-10-CM

## 2015-11-20 MED ORDER — CEPHALEXIN 500 MG PO CAPS: 500.0000 mg | ORAL_CAPSULE | Freq: Two times a day (BID) | ORAL | Status: DC

## 2015-11-20 NOTE — Patient Instructions (Signed)
We will call you with the ultrasound results. 

## 2015-11-20 NOTE — Progress Notes (Signed)
Subjective:     Patient ID: Desiree [name], female   DOB: 05/03/1976, 40 y.o.   MRN: [id number]  HPI  Chief Complaint  Patient presents with  . Arm Swelling    Patient comes in office today with concerns of intermittent swelling in the left arm for the past 5 days. Patient [facility] and was sent home not knowing cause of swelling. Today patient reports that area is red and tight.  At the time of ER evaluation swelling was not present. States swelling is painless. Reports hitting her left forearm against the door jam and using a paint roller for several hours prior to the onset of symptoms.   Review of Systems     Objective:   Physical Exam  Constitutional: She appears well-developed and well-nourished. No distress.  Cardiovascular:  Pulses:      Radial pulses are 2+ on the left side.  Musculoskeletal: She exhibits edema (marked/diffuse of her proximal left upper extremity. ). She exhibits no tenderness.  Can range her left shoulder and arm without pain. EF/EE/grip 5/5.  Skin:  Linear area of purple discoloration noted in her upper bicep area. NO erythema or tenderness  ulnar pulse 2+     Assessment:    1. Edema of upper extremity: coexamined with Dr. Sherrie MustacheFisher - cephALEXin (KEFLEX) 500 MG capsule; Take 1 capsule (500 mg total) by mouth 2 (two) times daily.  Dispense: 14 capsule; Refill: 0 - US Venous Img Upper Uni Left; Future    Plan:    Further f/u pending ultrasound result.

## 2015-11-23 ENCOUNTER — Inpatient Hospital Stay
Admission: EM | Admit: 2015-11-23 | Discharge: 2015-11-25 | DRG: 272 | Disposition: A | Discharge status: Home / Self Care | Payer: BLUE CROSS/BLUE SHIELD | Attending: Internal Medicine | Admitting: Internal Medicine

## 2015-11-23 ENCOUNTER — Telehealth: Payer: Self-pay | Admitting: Family Medicine

## 2015-11-23 ENCOUNTER — Emergency Department: Payer: BLUE CROSS/BLUE SHIELD

## 2015-11-23 ENCOUNTER — Ambulatory Visit: Admission: RE | Admit: 2015-11-23 | Payer: BLUE CROSS/BLUE SHIELD | Source: Ambulatory Visit

## 2015-11-23 ENCOUNTER — Encounter: Payer: Self-pay | Admitting: Emergency Medicine

## 2015-11-23 ENCOUNTER — Ambulatory Visit
Admission: RE | Admit: 2015-11-23 | Discharge: 2015-11-23 | Disposition: A | Discharge status: Home / Self Care | Payer: BLUE CROSS/BLUE SHIELD | Source: Ambulatory Visit | Attending: Family Medicine | Admitting: Family Medicine

## 2015-11-23 ENCOUNTER — Other Ambulatory Visit: Payer: Self-pay | Admitting: Family Medicine

## 2015-11-23 DIAGNOSIS — I82402 Acute embolism and thrombosis of unspecified deep veins of left lower extremity: Secondary | ICD-10-CM | POA: Diagnosis not present

## 2015-11-23 DIAGNOSIS — R0789 Other chest pain: Secondary | ICD-10-CM | POA: Diagnosis present

## 2015-11-23 DIAGNOSIS — I82B12 Acute embolism and thrombosis of left subclavian vein: Principal | ICD-10-CM | POA: Diagnosis present

## 2015-11-23 DIAGNOSIS — Z825 Family history of asthma and other chronic lower respiratory diseases: Secondary | ICD-10-CM | POA: Diagnosis not present

## 2015-11-23 DIAGNOSIS — M7989 Other specified soft tissue disorders: Secondary | ICD-10-CM

## 2015-11-23 DIAGNOSIS — I8290 Acute embolism and thrombosis of unspecified vein: Secondary | ICD-10-CM

## 2015-11-23 DIAGNOSIS — I10 Essential (primary) hypertension: Secondary | ICD-10-CM | POA: Diagnosis not present

## 2015-11-23 DIAGNOSIS — R079 Chest pain, unspecified: Secondary | ICD-10-CM

## 2015-11-23 DIAGNOSIS — Z833 Family history of diabetes mellitus: Secondary | ICD-10-CM | POA: Diagnosis not present

## 2015-11-23 DIAGNOSIS — I82409 Acute embolism and thrombosis of unspecified deep veins of unspecified lower extremity: Secondary | ICD-10-CM | POA: Diagnosis not present

## 2015-11-23 DIAGNOSIS — R0602 Shortness of breath: Secondary | ICD-10-CM | POA: Diagnosis not present

## 2015-11-23 DIAGNOSIS — G540 Brachial plexus disorders: Secondary | ICD-10-CM | POA: Diagnosis present

## 2015-11-23 DIAGNOSIS — I82A12 Acute embolism and thrombosis of left axillary vein: Secondary | ICD-10-CM | POA: Diagnosis not present

## 2015-11-23 DIAGNOSIS — D6859 Other primary thrombophilia: Secondary | ICD-10-CM | POA: Diagnosis not present

## 2015-11-23 DIAGNOSIS — I82629 Acute embolism and thrombosis of deep veins of unspecified upper extremity: Secondary | ICD-10-CM | POA: Diagnosis not present

## 2015-11-23 DIAGNOSIS — Z8249 Family history of ischemic heart disease and other diseases of the circulatory system: Secondary | ICD-10-CM | POA: Diagnosis not present

## 2015-11-23 DIAGNOSIS — R2242 Localized swelling, mass and lump, left lower limb: Secondary | ICD-10-CM | POA: Diagnosis not present

## 2015-11-23 DIAGNOSIS — Z7901 Long term (current) use of anticoagulants: Secondary | ICD-10-CM | POA: Diagnosis not present

## 2015-11-23 DIAGNOSIS — I82622 Acute embolism and thrombosis of deep veins of left upper extremity: Secondary | ICD-10-CM | POA: Diagnosis not present

## 2015-11-23 DIAGNOSIS — F17210 Nicotine dependence, cigarettes, uncomplicated: Secondary | ICD-10-CM | POA: Diagnosis present

## 2015-11-23 LAB — TROPONIN I: Troponin I: <0.03 ng/mL (ref <0.031)

## 2015-11-23 LAB — COMPREHENSIVE METABOLIC PANEL
ALT: 12 U/L — AB (ref 14–54)
AST: 21 U/L (ref 15–41)
Albumin: 4 g/dL (ref 3.5–5.0)
Alkaline Phosphatase: 76 U/L (ref 38–126)
Anion gap: 8 (ref 5–15)
BILIRUBIN TOTAL: 1 mg/dL (ref 0.3–1.2)
BUN: 8 mg/dL (ref 6–20)
CO2: 25 mmol/L (ref 22–32)
CREATININE: 0.69 mg/dL (ref 0.44–1.00)
Calcium: 8.8 mg/dL — ABNORMAL LOW (ref 8.9–10.3)
Chloride: 104 mmol/L (ref 101–111)
GFR calc Af Amer: >60 mL/min (ref >60)
Glucose, Bld: 106 mg/dL — ABNORMAL HIGH (ref 65–99)
POTASSIUM: 3.5 mmol/L (ref 3.5–5.1)
Sodium: 137 mmol/L (ref 135–145)
TOTAL PROTEIN: 7.5 g/dL (ref 6.5–8.1)

## 2015-11-23 LAB — CBC WITH DIFFERENTIAL/PLATELET
BASOS ABS: 0 10*3/uL (ref 0–0.1)
Basophils Relative: 0 %
Eosinophils Absolute: 0 10*3/uL (ref 0–0.7)
Eosinophils Relative: 1 %
HEMATOCRIT: 39.2 % (ref 35.0–47.0)
Hemoglobin: 13.7 g/dL (ref 12.0–16.0)
LYMPHS ABS: 1.1 10*3/uL (ref 1.0–3.6)
MCH: 31.5 pg (ref 26.0–34.0)
MCHC: 34.9 g/dL (ref 32.0–36.0)
MCV: 90.2 fL (ref 80.0–100.0)
MONO ABS: 0.6 10*3/uL (ref 0.2–0.9)
Monocytes Relative: 7 %
NEUTROS ABS: 6.3 10*3/uL (ref 1.4–6.5)
Neutrophils Relative %: 78 %
Platelets: 138 10*3/uL — ABNORMAL LOW (ref 150–440)
RBC: 4.34 MIL/uL (ref 3.80–5.20)
RDW: 13.2 % (ref 11.5–14.5)
WBC: 8 10*3/uL (ref 3.6–11.0)

## 2015-11-23 LAB — HEPARIN LEVEL (UNFRACTIONATED): Heparin Unfractionated: 0.56 IU/mL (ref 0.30–0.70)

## 2015-11-23 LAB — APTT: aPTT: 32 seconds (ref 24–36)

## 2015-11-23 LAB — POCT PREGNANCY, URINE: Preg Test, Ur: NEGATIVE

## 2015-11-23 LAB — PROTIME-INR
INR: 0.98
Prothrombin Time: 13.2 seconds (ref 11.4–15.0)

## 2015-11-23 MED ORDER — NICOTINE 14 MG/24HR TD PT24
MEDICATED_PATCH | TRANSDERMAL | Status: AC
  Administered 2015-11-23: 14 mg
  Filled 2015-11-23: qty 1

## 2015-11-23 MED ORDER — LORAZEPAM 2 MG/ML IJ SOLN
0.5000 mg | INTRAMUSCULAR | Status: AC
  Administered 2015-11-23: 0.5 mg via INTRAVENOUS
  Filled 2015-11-23: qty 1

## 2015-11-23 MED ORDER — CEFAZOLIN SODIUM-DEXTROSE 2-4 GM/100ML-% IV SOLN
2.0000 g | INTRAVENOUS | Status: DC
  Filled 2015-11-23 (×2): qty 100

## 2015-11-23 MED ORDER — ACETAMINOPHEN 650 MG RE SUPP: 650.0000 mg | Freq: Four times a day (QID) | RECTAL | Status: DC | PRN

## 2015-11-23 MED ORDER — RIVAROXABAN 15 MG PO TABS: 15.0000 mg | ORAL_TABLET | Freq: Once | ORAL | Status: DC

## 2015-11-23 MED ORDER — MORPHINE SULFATE (PF) 2 MG/ML IV SOLN: 2.0000 mg | INTRAVENOUS | Status: DC | PRN

## 2015-11-23 MED ORDER — HEPARIN SODIUM (PORCINE) 5000 UNIT/ML IJ SOLN
INTRAMUSCULAR | Status: AC
  Administered 2015-11-23: 3300 [IU]
  Filled 2015-11-23: qty 1

## 2015-11-23 MED ORDER — SODIUM CHLORIDE 0.9% FLUSH
3.0000 mL | Freq: Two times a day (BID) | INTRAVENOUS | Status: DC
  Administered 2015-11-23 – 2015-11-24 (×3): 3 mL via INTRAVENOUS

## 2015-11-23 MED ORDER — HEPARIN BOLUS VIA INFUSION
3300.0000 [IU] | Freq: Once | INTRAVENOUS | Status: AC
  Administered 2015-11-23: 3300 [IU] via INTRAVENOUS
  Filled 2015-11-23: qty 3300

## 2015-11-23 MED ORDER — SODIUM CHLORIDE 0.9 % IV BOLUS (SEPSIS)
1000.0000 mL | Freq: Once | INTRAVENOUS | Status: AC
  Administered 2015-11-23: 1000 mL via INTRAVENOUS

## 2015-11-23 MED ORDER — DOCUSATE SODIUM 100 MG PO CAPS
100.0000 mg | ORAL_CAPSULE | Freq: Two times a day (BID) | ORAL | Status: DC
  Administered 2015-11-23 – 2015-11-24 (×2): 100 mg via ORAL
  Filled 2015-11-23 (×2): qty 1

## 2015-11-23 MED ORDER — IOPAMIDOL (ISOVUE-370) INJECTION 76%
75.0000 mL | Freq: Once | INTRAVENOUS | Status: AC | PRN
  Administered 2015-11-23: 75 mL via INTRAVENOUS

## 2015-11-23 MED ORDER — HEPARIN (PORCINE) IN NACL 100-0.45 UNIT/ML-% IJ SOLN
900.0000 [IU]/h | INTRAMUSCULAR | Status: DC
  Administered 2015-11-23: 900 [IU]/h via INTRAVENOUS
  Filled 2015-11-23 (×2): qty 250

## 2015-11-23 MED ORDER — LORAZEPAM 0.5 MG PO TABS
0.5000 mg | ORAL_TABLET | Freq: Once | ORAL | Status: AC
  Administered 2015-11-23: 0.5 mg via ORAL
  Filled 2015-11-23: qty 1

## 2015-11-23 MED ORDER — OXYCODONE HCL 5 MG PO TABS: 5.0000 mg | ORAL_TABLET | ORAL | Status: DC | PRN

## 2015-11-23 MED ORDER — HYDROCHLOROTHIAZIDE 25 MG PO TABS
25.0000 mg | ORAL_TABLET | Freq: Every day | ORAL | Status: DC
  Administered 2015-11-25: 25 mg via ORAL
  Filled 2015-11-23: qty 1

## 2015-11-23 MED ORDER — OXYCODONE HCL 5 MG PO TABS
10.0000 mg | ORAL_TABLET | Freq: Once | ORAL | Status: AC
  Administered 2015-11-23: 10 mg via ORAL
  Filled 2015-11-23: qty 2

## 2015-11-23 MED ORDER — ACETAMINOPHEN 325 MG PO TABS
650.0000 mg | ORAL_TABLET | Freq: Four times a day (QID) | ORAL | Status: DC | PRN
  Administered 2015-11-24: 650 mg via ORAL
  Filled 2015-11-23: qty 2

## 2015-11-23 NOTE — Telephone Encounter (Signed)
[facility] report; positive DVT in left upper ext that distends from the perphrerial aspect of the left subclavian vein through both paired brachial veins. Positive exclusive SVT involving the cephalic vein.

## 2015-11-23 NOTE — Progress Notes (Signed)
ANTICOAGULATION CONSULT NOTE - Initial Consult  Pharmacy Consult for Heparin Indication: DVT  Allergies  Allergen Reactions  . Venlafaxine     Increases Blood Pressure    Patient Measurements: Height: 5\' 4"  (162.6 cm) Weight: 120 lb (54.432 kg) IBW/kg (Calculated) : 54.7 Heparin Dosing Weight: 54.4 kg  Vital Signs: Temp: 98.1 F (36.7 C) (05/11 1133) Temp Source: Oral (05/11 1133) BP: 141/88 mmHg (05/11 1130) Pulse Rate: 75 (05/11 1130)  Labs:  Recent Labs  11/23/15 1116  HGB 13.7  HCT 39.2  PLT 138*  APTT 32  LABPROT 13.2  INR 0.98  CREATININE 0.69  TROPONINI <0.03    Estimated Creatinine Clearance: 81.1 mL/min (by C-G formula based on Cr of 0.69).   Medical History: Past Medical History  Diagnosis Date  . Hypertension   . Anxiety   . Depression     Medications:  Scheduled:  . heparin  3,300 Units Intravenous Once   Infusions:  . heparin      Assessment: 40 y/o F admitted with LUE DVT.   Goal of Therapy:  Heparin level 0.3-0.7 units/ml Monitor platelets by anticoagulation protocol: Yes   Plan:  Give 3300 units bolus x 1 Start heparin infusion at 900 units/hr Check anti-Xa level in 6 hours and daily while on heparin Continue to monitor H&H and platelets  Luisa HartChristy, Emeric Novinger D 11/23/2015,2:38 PM

## 2015-11-23 NOTE — ED Provider Notes (Addendum)
[facility] Emergency Department Provider Note   ____________________________________________  Time seen: Approximately 11:14 AM  I have reviewed the triage vital signs and the nursing notes.   HISTORY  Chief Complaint DVT    HPI Desiree [name] is a 40 y.o. female with hypertension, bipolar disorder who presents for evaluation of nearly a week of left arm swelling and tightness, gradual onset, constant since onset, moderate, no modifying factors. Patient reports that initially she thought that it began as bruising because she accdentally hit her arm on a wall however the swelling has persisted. She was seen here on 11/17/2015 for the same symptoms as well as some chest tightness and was discharged after reassuring lab work and x-ray were obtained. No ultrasound was performed at that time. She was told to follow-up with her primary care doctor which she did today, he ordered an ultrasound which returned positive for DVT and she was sent to the emergency department for further evaluation. She reports she has intermittent had chest tightness but is not having any currently. She denies any history of coronary artery disease.   Past Medical History  Diagnosis Date  . Hypertension   . Anxiety   . Depression     Patient Active Problem List   Diagnosis Date Noted  . Abnormal cells of cervix 07/26/2015  . Atypical squamous cells of undetermined significance (ASC-US) on cervical Pap smear 12/28/2014  . Affective bipolar disorder (HCC) 12/28/2014  . H/O renal calculi 12/28/2014  . Essential (primary) hypertension 12/28/2014    Past Surgical History  Procedure Laterality Date  . Tubal ligation  07/2010    WITH ABLATION  . Uterine ablation      Current Outpatient Rx  Name  Route  Sig  Dispense  Refill  . cephALEXin (KEFLEX) 500 MG capsule   Oral   Take 1 capsule (500 mg total) by mouth 2 (two) times daily.   14 capsule   0   . hydrochlorothiazide  (HYDRODIURIL) 25 MG tablet      TAKE 1 TABLET BY MOUTH EVERY DAY   90 tablet   3   . ibuprofen (ADVIL,MOTRIN) 200 MG tablet   Oral   Take 800 mg by mouth every 8 (eight) hours as needed for mild pain.           Allergies Venlafaxine  Family History  Problem Relation Age of Onset  . Diabetes Mother   . Coronary artery disease Mother   . Heart disease Mother   . Asthma Father     Social History Social History  Substance Use Topics  . Smoking status: Current Some Day Smoker -- 0.50 packs/day for 20 years    Types: Cigarettes  . Smokeless tobacco: Never Used  . Alcohol Use: No     Comment: OCCASIONALLY    Review of Systems Constitutional: No fever/chills Eyes: No visual changes. ENT: No sore throat. Cardiovascular: + chest pain. Respiratory: Denies shortness of breath. Gastrointestinal: No abdominal pain.  No nausea, no vomiting.  No diarrhea.  No constipation. Genitourinary: Negative for dysuria. Musculoskeletal: Negative for back pain. Skin: Negative for rash. Neurological: Negative for headaches, focal weakness or numbness.  10-point ROS otherwise negative.  ____________________________________________   PHYSICAL EXAM:  Filed Vitals:   11/23/15 1120 11/23/15 1130 11/23/15 1133  BP: 141/88 141/88   Pulse: 70 75   Temp: 98.3 F (36.8 C)  98.1 F (36.7 C)  TempSrc: Oral  Oral  Resp: 15 16   Height:  5\' 2"  (1.575 m) 5\' 4"  (1.626 m)   Weight: 120 lb (54.432 kg) 120 lb (54.432 kg)   SpO2: 95% 96%       Constitutional: Alert and oriented. Well appearing and in no acute distress. Eyes: Conjunctivae are normal. PERRL. EOMI. Head: Atraumatic. Nose: No congestion/rhinnorhea. Mouth/Throat: Mucous membranes are moist.  Oropharynx non-erythematous. Neck: No stridor. Supple without meningismus. Cardiovascular: Normal rate, regular rhythm. Grossly normal heart sounds.  Good peripheral circulation. Respiratory: Normal respiratory effort.  No retractions.  Lungs CTAB. Gastrointestinal: Soft and nontender. No distention.  No CVA tenderness. Genitourinary: deferred Musculoskeletal: Tenderness throughout the left arm, worse in the left upper arm. 2+ left radial pulse, left radial, median and ulnar nerve intact, normal sensation in the left arm. Neurologic:  Normal speech and language. No gross focal neurologic deficits are appreciated. No gait instability. Skin:  Skin is warm, dry and intact. No rash noted. Psychiatric: Mood and affect are normal. Speech and behavior are normal.  ____________________________________________   LABS (all labs ordered are listed, but only abnormal results are displayed)  Labs Reviewed  CBC WITH DIFFERENTIAL/PLATELET - Abnormal; Notable for the following:    Platelets 138 (*)    All other components within normal limits  COMPREHENSIVE METABOLIC PANEL - Abnormal; Notable for the following:    Glucose, Bld 106 (*)    Calcium 8.8 (*)    ALT 12 (*)    All other components within normal limits  TROPONIN I  PROTIME-INR  APTT  POC URINE PREG, ED  POCT PREGNANCY, URINE   ____________________________________________  EKG  ED ECG REPORT I, Gayla Doss, the attending physician, personally viewed and interpreted this ECG.   Date: 11/23/2015  EKG Time: 11:16  Rate: 58  Rhythm: sinus bradycardiaWith PACs.  Axis: normal  Intervals:none  ST&T Change: No acute ST elevation.  ____________________________________________  RADIOLOGY  CTA chest IMPRESSION: No evidence of pulmonary embolism.  Changes in the left subclavian vein consistent with the known history of deep venous thrombosis.  No other focal abnormality  US venous left am 1. Examination is positive for occlusive DVT extending from the peripheral aspect of the left subclavian vein through both paired brachial veins. 2. Examination is positive for occlusive SVT involving the central aspect of the left cephalic vein. This was made a call  report. ____________________________________________   PROCEDURES  Procedure(s) performed: None  Critical Care performed: No  ____________________________________________   INITIAL IMPRESSION / ASSESSMENT AND PLAN / ED COURSE  Pertinent labs & imaging results that were available during my care of the patient were reviewed by me and considered in my medical decision making (see chart for details).  Desiree [name] is a 40 y.o. female with hypertension, bipolar disorder who presents for evaluation of nearly a week of left arm swelling and tightness results from outpatient ultrasound with positive DVT in the left upper extremity. On exam, she is generally well-appearing and in no acute distress. Her vital signs are stable, she is afebrile. She is neurovascularly intact in the left arm though there is some mild to moderate swelling, compartments are soft, no apartment syndrome. EKG reassuring however given her complaint of chest pain intermittently/chest tightness will obtain CTA chest given known DVT. We'll obtain screening labs, disposition.  ----------------------------------------- 3:06 PM on 11/23/2015 -----------------------------------------  CTA chest negative for PE. I discussed with Dr. Lorretta Harp and the patient is a candidate for thrombectomy. The patient desires to stay here at [facility] for treatment and she  does desire thrombectomy so will start heparin drip in preparation for the procedure tomorrow as discussed with Dr. Lorretta HarpSchneir. I discussed the case with the hospitalist, D. Gouru, for admission and she will admit. ____________________________________________   FINAL CLINICAL IMPRESSION(S) / ED DIAGNOSES  Final diagnoses:  DVT (deep venous thrombosis), left  Chest pain, unspecified chest pain type      NEW MEDICATIONS STARTED DURING THIS VISIT:  New Prescriptions   No medications on file     Note:  This document was prepared using Dragon  voice recognition software and may include unintentional dictation errors.    Gayla DossEryka A Eliot Popper, MD 11/23/15 1530  Gayla DossEryka A Thatiana Renbarger, MD 11/23/15 [phone]

## 2015-11-23 NOTE — Consult Note (Signed)
[facility] Admission History & Physical  MRN : [id number]  Desiree [name] is a 40 y.o. ([birth year]) female who presents with chief complaint of  Chief Complaint  Patient presents with  . DVT  .  History of Present Illness: I am asked to evaluate the patient by Dr. Dondra Spry in the ER. She is a 40 year old who presented to the ER approximately 1 week ago with left arm swelling and increasing pain. Conservative therapies were recommended at that time. Unfortunately her symptoms have been steadily worsening over the ensuing week. She was seen by her primary doctor who ordered an ultrasound which demonstrated acute DVT within the subclavian and axillary veins.  Patient denies recent trauma. She does not give a history of significant repetitive overhead work. She does not give a history of frequent weight lifting or exercise. 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  There is no past history of DVT there is no family history of DVT. She denies shortness of breath or pleuritic chest pains. No recent surgeries.  Current Facility-Administered Medications  Medication Dose Route Frequency Provider Last Rate Last Dose  . [START ON 11/24/2015] ceFAZolin (ANCEF) IVPB 2g/100 mL premix  2 g Intravenous On Call to OR Renford Dills, MD      . heparin ADULT infusion 100 units/mL (25000 units/250 mL)  900 Units/hr Intravenous Continuous Gayla Doss, MD 9 mL/hr at 11/23/15 1558 900 Units/hr at 11/23/15 1558    Past Medical History  Diagnosis Date  . Hypertension   . Anxiety   . Depression     Past Surgical History  Procedure Laterality Date  . Tubal ligation  07/2010    WITH ABLATION  . Uterine ablation      Social History Social History  Substance Use Topics  . Smoking status: Current Some Day Smoker -- 0.50 packs/day for 20 years    Types: Cigarettes  . Smokeless tobacco: Never Used  . Alcohol Use: No      Comment: OCCASIONALLY    Family History Family History  Problem Relation Age of Onset  . Diabetes Mother   . Coronary artery disease Mother   . Heart disease Mother   . Asthma Father   No family history of bleeding clotting disorders, porphyria or autoimmune disease  Allergies  Allergen Reactions  . Venlafaxine     Increases Blood Pressure     REVIEW OF SYSTEMS (Negative unless checked)  Constitutional: [] Weight loss  [] Fever  [] Chills Cardiac: [] Chest pain   [] Chest pressure   [] Palpitations   [] Shortness of breath when laying flat   [] Shortness of breath at rest   [] Shortness of breath with exertion. Vascular:  [] Pain in legs with walking   [] Pain in legs at rest   [] Pain in legs when laying flat   [] Claudication   [] Pain in feet when walking  [] Pain in feet at rest  [] Pain in feet when laying flat   [] History of DVT   [] Phlebitis   [x] Swelling in legs   [] Varicose veins   [] Non-healing ulcers Pulmonary:   [] Uses home oxygen   [] Productive cough   [] Hemoptysis   [] Wheeze  [] COPD   [] Asthma Neurologic:  [] Dizziness  [] Blackouts   [] Seizures   [] History of stroke   [] History of TIA  [] Aphasia   [] Temporary blindness   [] Dysphagia   [] Weakness or numbness in arms   [] Weakness or numbness in legs  Musculoskeletal:  [] Arthritis   [] Joint swelling   [] Joint pain   [] Low back pain Hematologic:  [] Easy bruising  [] Easy bleeding   [] Hypercoagulable state   [] Anemic  [] Hepatitis Gastrointestinal:  [] Blood in stool   [] Vomiting blood  [] Gastroesophageal reflux/heartburn   [] Difficulty swallowing. Genitourinary:  [] Chronic kidney disease   [] Difficult urination  [] Frequent urination  [] Burning with urination   [] Blood in urine Skin:  [] Rashes   [] Ulcers   [] Wounds Psychological:  [] History of anxiety   []  History of major depression.  Physical Examination  Filed Vitals:   11/23/15 1830 11/23/15 1900 11/23/15 1930 11/23/15 2004  BP: 137/90 131/81 139/85 143/83  Pulse: 58 65 63 74   Temp:    98.3 F (36.8 C)  TempSrc:    Oral  Resp: 15 24 19 16   Height:      Weight:      SpO2: 99% 96% 99% 100%   Body mass index is 20.59 kg/(m^2). Gen: WD/WN, NAD Head: [place]/AT, No temporalis wasting. Prominent temp pulse not noted. Ear/Nose/Throat: Hearing grossly intact, nares w/o erythema or drainage, oropharynx w/o Erythema/Exudate,  Eyes: PERRLA, EOMI.  Neck: Supple, no nuchal rigidity.  No bruit or JVD.  Pulmonary:  Good air movement, clear to auscultation bilaterally, no use of accessory muscles.  Cardiac: RRR, normal S1, S2, no Murmurs, rubs or gallops. Vascular: Left arm is significantly edematous there are extensive small venule is now noted at the level of the deltoid. The left hand and fingers are pink warm and well perfused Vessel Right Left  Radial Palpable Palpable  Ulnar Palpable Palpable  Brachial Palpable Palpable  Carotid Palpable, without bruit Palpable, without bruit  Aorta Not palpable N/A  Femoral Palpable Palpable  Popliteal Palpable Palpable  PT Palpable Palpable  DP Palpable Palpable   Gastrointestinal: soft, non-tender/non-distended. No guarding/reflex.  Musculoskeletal: M/S 5/5 throughout.  Extremities without ischemic changes.  No deformity or atrophy.  Neurologic: CN 2-12 intact. Pain and light touch intact in extremities.  Symmetrical.  Speech is fluent. Motor exam as listed above. Psychiatric: Judgment intact, Mood & affect appropriate for pt's clinical situation. Dermatologic: No rashes or ulcers noted.  No cellulitis or open wounds. Lymph : No Cervical, Axillary, or Inguinal lymphadenopathy.    CBC Lab Results  Component Value Date   WBC 8.0 11/23/2015   HGB 13.7 11/23/2015   HCT 39.2 11/23/2015   MCV 90.2 11/23/2015   PLT 138* 11/23/2015    BMET    Component Value Date/Time   NA 137 11/23/2015 1116   NA 135* 06/04/2012 0129   K 3.5 11/23/2015 1116   K 3.3* 06/04/2012 0129   CL 104 11/23/2015 1116   CL 101 06/04/2012 0129    CO2 25 11/23/2015 1116   CO2 26 06/04/2012 0129   GLUCOSE 106* 11/23/2015 1116   GLUCOSE 175* 06/04/2012 0129   BUN 8 11/23/2015 1116   BUN 7 06/04/2012 0129   CREATININE 0.69 11/23/2015 1116   CREATININE 0.97 06/04/2012 0129   CALCIUM 8.8* 11/23/2015 1116   CALCIUM 9.2 06/04/2012 0129   GFRNONAA >60 11/23/2015 1116   GFRNONAA >60 06/04/2012 0129   GFRAA >60 11/23/2015 1116   GFRAA >60 06/04/2012 0129   Estimated Creatinine Clearance: 81.1 mL/min (by C-G formula based on Cr of 0.69).  COAG Lab Results  Component Value Date   INR 0.98 11/23/2015    Radiology Dg Chest 2 View  11/17/2015  CLINICAL DATA:  Chest pain, left arm swelling, and chest tightness for  6 days. EXAM: CHEST - 2 VIEW COMPARISON:  None. FINDINGS: The heart size and mediastinal contours are within normal limits. Both lungs are clear. The visualized skeletal structures are unremarkable. IMPRESSION: No active disease. Electronically Signed   By: Marin Roberts M.D.   On: 11/17/2015 10:57   Ct Angio Chest Pe W/cm &/or Wo Cm  11/23/2015  CLINICAL DATA:  Left arm DVT with shortness of breath and chest pain for 5 days EXAM: CT ANGIOGRAPHY CHEST WITH CONTRAST TECHNIQUE: Multidetector CT imaging of the chest was performed using the standard protocol during bolus administration of intravenous contrast. Multiplanar CT image reconstructions and MIPs were obtained to evaluate the vascular anatomy. CONTRAST:  75 mL Isovue 370. COMPARISON:  None. FINDINGS: Lungs are well aerated bilaterally without focal infiltrate or sizable effusion. The thoracic inlet is within normal limits. Increased density is noted in the left subclavian vein and cephalic and axillary veins with surrounding inflammatory change consistent with the given clinical history of deep venous thrombosis. The thoracic aorta and its branches are within normal limits. The pulmonary artery demonstrates a normal branching pattern without filling defect to suggest pulmonary  embolism. No right heart strain is seen. No thrombus is noted within the atrium or right ventricle. No hilar or mediastinal adenopathy is seen. Visualized upper abdomen shows the gallbladder to be decompressed. No other focal abnormality is seen. The bony structures are within normal limits. Review of the MIP images confirms the above findings. IMPRESSION: No evidence of pulmonary embolism. Changes in the left subclavian vein consistent with the known history of deep venous thrombosis. No other focal abnormality Electronically Signed   By: Alcide Clever M.D.   On: 11/23/2015 13:05   US Venous Img Upper Uni Left  11/23/2015  CLINICAL DATA:  Pain swelling involving the left upper extremity past week. History of smoking. Evaluate for DVT. EXAM: LEFT UPPER EXTREMITY VENOUS DOPPLER ULTRASOUND TECHNIQUE: Gray-scale sonography with graded compression, as well as color Doppler and duplex ultrasound were performed to evaluate the upper extremity deep venous system from the level of the subclavian vein and including the jugular, axillary, basilic, radial, ulnar and upper cephalic vein. Spectral Doppler was utilized to evaluate flow at rest and with distal augmentation maneuvers. COMPARISON:  None. FINDINGS: Contralateral Subclavian Vein: Respiratory phasicity is normal and symmetric with the symptomatic side. No evidence of thrombus. Normal compressibility. Internal Jugular Vein: No evidence of thrombus. Normal compressibility, respiratory phasicity and response to augmentation. There is mixed echogenic DVT within the left subclavian vein. The DVT appears nonocclusive centrally (image 9), though occlusive about its distal aspect (image 13). There is occlusive DVT within the imaged course of the left axillary vein (representative images 15 and 16), extending to involve each of the paired brachial veins (representative images 37 and 40) The left radial ulnar veins appear patent where imaged. There is occlusive SVT within the  proximal aspect of the left cephalic vein (representative images 19 and 20). The mid and distal aspects of the cephalic vein appear patent. The basilic vein appears patent were imaged. Other Findings:  None visualized. IMPRESSION: 1. Examination is positive for occlusive DVT extending from the peripheral aspect of the left subclavian vein through both paired brachial veins. 2. Examination is positive for occlusive SVT involving the central aspect of the left cephalic vein. This was made a call report. Electronically Signed   By: Simonne Come M.D.   On: 11/23/2015 10:29    Assessment/Plan 1.  Left subclavian, bilateral brachial and  left cephalic vein DVT:  Heparin bolus and drip per pharmacy consult.  I will plan for lytic therapy tomorrow. I explained the risks and benefits as well as alternative therapies. All questions of been answered patient agrees to proceed with thrombolysis and possible venous intervention. Pain management as needed Oncology consult for coag workup  2.  Essential hypertension:  Resume home medication hydrochlorothiazide and titrate as needed  3.  History of bipolar disorder not on any medications. Continue close monitoring.      Schnier, Latina CraverGregory G, MD  11/23/2015 8:06 PM

## 2015-11-23 NOTE — H&P (Signed)
[facility] [place] [facility]   PATIENT NAME: Desiree [name]    MR#:  [id number]  DATE OF BIRTH:  12/03/1975  DATE OF ADMISSION:  11/23/2015  PRIMARY CARE PHYSICIAN: Anola Gurneyobert Chauvin, PA   REQUESTING/REFERRING PHYSICIAN: Dr. Inocencio HomesGayle  CHIEF COMPLAINT:  Chest tightness and left subclavian DVT  HISTORY OF PRESENT ILLNESS:  Desiree [name]  is a 40 y.o. female with a known history of Essential hypertension, anxiety and depression was seen by the ED physician approximately a week ago for left arm swelling and tightness which was gradual in onset and constant in nature. She was seen in the ED on 11/17/2015 patient was discharged as her labs and x-ray were fine. She was seen by her primary care physician today, he ordered an ultrasound which was positive for left subclavian DVT and patient was sent over to the emergency department. ED physician Dr. Inocencio HomesGayle has discussed with on-call vascular surgery Dr. Gilda CreaseSchnier, who has scheduled her for thrombectomy tomorrow. Heparin drip was initiated in the ED. CTA chest is negative for pulmonary embolism and hospitalist team is called to admit the patient. Patient denies any complaints during my examination and wants to eat food as she was very hungry. Denies any history of cancer or clotting disorders  PAST MEDICAL HISTORY:   Past Medical History  Diagnosis Date  . Hypertension   . Anxiety   . Depression     PAST SURGICAL HISTOIRY:   Past Surgical History  Procedure Laterality Date  . Tubal ligation  07/2010    WITH ABLATION  . Uterine ablation      SOCIAL HISTORY:   Social History  Substance Use Topics  . Smoking status: Current Some Day Smoker -- 0.50 packs/day for 20 years    Types: Cigarettes  . Smokeless tobacco: Never Used  . Alcohol Use: No     Comment: OCCASIONALLY    FAMILY HISTORY:   Family History  Problem Relation Age of Onset  . Diabetes Mother   . Coronary artery disease Mother   . Heart disease  Mother   . Asthma Father     DRUG ALLERGIES:   Allergies  Allergen Reactions  . Venlafaxine     Increases Blood Pressure    REVIEW OF SYSTEMS:  CONSTITUTIONAL: No fever, fatigue or weakness.  EYES: No blurred or double vision.  EARS, NOSE, AND THROAT: No tinnitus or ear pain.  RESPIRATORY: No cough, shortness of breath, wheezing or hemoptysis.  CARDIOVASCULAR: Reporting intermittent episodes of  chest pain, denies orthopnea, edema.  GASTROINTESTINAL: No nausea, vomiting, diarrhea or abdominal pain.  GENITOURINARY: No dysuria, hematuria.  ENDOCRINE: No polyuria, nocturia,  HEMATOLOGY: No anemia, easy bruising or bleeding SKIN: No rash or lesion. MUSCULOSKELETAL: Left arm swelling and tightness. No joint pain or arthritis.   NEUROLOGIC: No tingling, numbness, weakness.  PSYCHIATRY: No anxiety or depression.   MEDICATIONS AT HOME:   Prior to Admission medications   Medication Sig Start Date End Date Taking? Authorizing Provider  cephALEXin (KEFLEX) 500 MG capsule Take 1 capsule (500 mg total) by mouth 2 (two) times daily. 11/20/15  Yes Anola Gurneyobert Chauvin, PA  hydrochlorothiazide (HYDRODIURIL) 25 MG tablet TAKE 1 TABLET BY MOUTH EVERY DAY 06/29/15  Yes Anola Gurneyobert Chauvin, PA  ibuprofen (ADVIL,MOTRIN) 200 MG tablet Take 800 mg by mouth every 8 (eight) hours as needed for mild pain.   Yes Historical Provider, MD      VITAL SIGNS:  Blood pressure 139/79, pulse 76, temperature 98.1 F (36.7  C), temperature source Oral, resp. rate 22, height  (1.626 m), weight 54.432 kg (120 lb), SpO2 100 %.  PHYSICAL EXAMINATION:  GENERAL:  40 y.o.-year-old patient lying in the bed with no acute distress.  EYES: Pupils equal, round, reactive to light and accommodation. No scleral icterus. Extraocular muscles intact.  HEENT: Head atraumatic, normocephalic. Oropharynx and nasopharynx clear.  NECK:  Supple, no jugular venous distention. No thyroid enlargement, no tenderness.  LUNGS: Normal breath sounds  bilaterally, no wheezing, rales,rhonchi or crepitation. No use of accessory muscles of respiration.  CARDIOVASCULAR: S1, S2 normal. No murmurs, rubs, or gallops. No reproducible anterior chest wall tenderness on palpation ABDOMEN: Soft, nontender, nondistended. Bowel sounds present. No organomegaly or mass.  EXTREMITIES: Left upper extremity is  edematous and tender. No pedal edema, cyanosis, or clubbing.  NEUROLOGIC: Cranial nerves II through XII are intact. Muscle strength 5/5 in all extremities. Sensation intact. Gait not checked.  PSYCHIATRIC: The patient is alert and oriented x 3.  SKIN: No obvious rash, lesion, or ulcer.   LABORATORY PANEL:   CBC  Recent Labs Lab 11/23/15 1116  WBC 8.0  HGB 13.7  HCT 39.2  PLT 138*   ------------------------------------------------------------------------------------------------------------------  Chemistries   Recent Labs Lab 11/23/15 1116  NA 137  K 3.5  CL 104  CO2 25  GLUCOSE 106*  BUN 8  CREATININE 0.69  CALCIUM 8.8*  AST 21  ALT 12*  ALKPHOS 76  BILITOT 1.0   ------------------------------------------------------------------------------------------------------------------  Cardiac Enzymes  Recent Labs Lab 11/23/15 1116  TROPONINI <0.03   ------------------------------------------------------------------------------------------------------------------  RADIOLOGY:  Ct Angio Chest Pe W/cm &/or Wo Cm  11/23/2015  CLINICAL DATA:  Left arm DVT with shortness of breath and chest pain for 5 days EXAM: CT ANGIOGRAPHY CHEST WITH CONTRAST TECHNIQUE: Multidetector CT imaging of the chest was performed using the standard protocol during bolus administration of intravenous contrast. Multiplanar CT image reconstructions and MIPs were obtained to evaluate the vascular anatomy. CONTRAST:  75 mL Isovue 370. COMPARISON:  None. FINDINGS: Lungs are well aerated bilaterally without focal infiltrate or sizable effusion. The thoracic inlet is  within normal limits. Increased density is noted in the left subclavian vein and cephalic and axillary veins with surrounding inflammatory change consistent with the given clinical history of deep venous thrombosis. The thoracic aorta and its branches are within normal limits. The pulmonary artery demonstrates a normal branching pattern without filling defect to suggest pulmonary embolism. No right heart strain is seen. No thrombus is noted within the atrium or right ventricle. No hilar or mediastinal adenopathy is seen. Visualized upper abdomen shows the gallbladder to be decompressed. No other focal abnormality is seen. The bony structures are within normal limits. Review of the MIP images confirms the above findings. IMPRESSION: No evidence of pulmonary embolism. Changes in the left subclavian vein consistent with the known history of deep venous thrombosis. No other focal abnormality Electronically Signed   By: Alcide Clever M.D.   On: 11/23/2015 13:05   US Venous Img Upper Uni Left  11/23/2015  CLINICAL DATA:  Pain swelling involving the left upper extremity past week. History of smoking. Evaluate for DVT. EXAM: LEFT UPPER EXTREMITY VENOUS DOPPLER ULTRASOUND TECHNIQUE: Gray-scale sonography with graded compression, as well as color Doppler and duplex ultrasound were performed to evaluate the upper extremity deep venous system from the level of the subclavian vein and including the jugular, axillary, basilic, radial, ulnar and upper cephalic vein. Spectral Doppler was utilized to evaluate flow at rest and  with distal augmentation maneuvers. COMPARISON:  None. FINDINGS: Contralateral Subclavian Vein: Respiratory phasicity is normal and symmetric with the symptomatic side. No evidence of thrombus. Normal compressibility. Internal Jugular Vein: No evidence of thrombus. Normal compressibility, respiratory phasicity and response to augmentation. There is mixed echogenic DVT within the left subclavian vein. The DVT  appears nonocclusive centrally (image 9), though occlusive about its distal aspect (image 13). There is occlusive DVT within the imaged course of the left axillary vein (representative images 15 and 16), extending to involve each of the paired brachial veins (representative images 37 and 40) The left radial ulnar veins appear patent where imaged. There is occlusive SVT within the proximal aspect of the left cephalic vein (representative images 19 and 20). The mid and distal aspects of the cephalic vein appear patent. The basilic vein appears patent were imaged. Other Findings:  None visualized. IMPRESSION: 1. Examination is positive for occlusive DVT extending from the peripheral aspect of the left subclavian vein through both paired brachial veins. 2. Examination is positive for occlusive SVT involving the central aspect of the left cephalic vein. This was made a call report. Electronically Signed   By: Simonne Come M.D.   On: 11/23/2015 10:29    EKG:   Orders placed or performed during the hospital encounter of 11/23/15  . ED EKG  . ED EKG    IMPRESSION AND PLAN:   Desiree [name]  is a 40 y.o. female with a known history of Essential hypertension, anxiety and depression was seen by the ED physician approximately a week ago for left arm swelling and tightness which was gradual in onset and constant in nature. She was seen in the ED on 11/17/2015 patient was discharged as her labs and x-ray were fine. She was seen by her primary care physician today, he ordered an ultrasound which was positive for left subclavian DVT and patient was sent over to the emergency department. ED physician Dr. Inocencio Homes has discussed with on-call vascular surgery Dr. Gilda Crease, who has scheduled her for thrombectomy tomorrow. Heparin drip was initiated in the ED  #Left subclavian, bilateral brachial and left cephalic vein DVT Admit to telemetry Heparin bolus and drip per pharmacy consult Vascular surgery Dr. snare was consulted  and scheduled for thrombectomy in a.m. as per ED physician's discussion Will keep her nothing by mouth after midnight Pain management as needed Oncology consult for coag workup  #Intermittent episodes of chest pain and tightness probably from left subclavian DVT Monitor patient on telemetry and cycle cardiac biomarkers Pain management as needed  #Essential hypertension Resume home medication hydrochlorothiazide and titrate as needed    #history of bipolar disorder not on any medications. Continue close monitoring.    DVT prophylaxis patient is on full dose heparin     All the records are reviewed and case discussed with ED provider. Management plans discussed with the patient, family and they are in agreement.  CODE STATUS: fc  TOTAL TIME TAKING CARE OF THIS PATIENT: 45  minutes.    Ramonita Lab M.D on 11/23/2015 at 5:22 PM  Between 7am to 6pm - Pager - [phone]  After 6pm go to [email or web address] - password [email or web address] [facility]  [facility]  Office  [phone]  CC: Primary care physician; Anola Gurney, PA

## 2015-11-23 NOTE — ED Notes (Signed)
nicotene patch to left arm.  primedoc in with pt for admission.

## 2015-11-23 NOTE — ED Notes (Signed)
Pt states she feels better.  Family with pt   Pt waiting on bed assignment.

## 2015-11-23 NOTE — Progress Notes (Signed)
ANTICOAGULATION CONSULT NOTE - Follow Up Consult  Pharmacy Consult for Heparin Indication: DVT  Allergies  Allergen Reactions  . Venlafaxine     Increases Blood Pressure    Patient Measurements: Height: 5\' 4"  (162.6 cm) Weight: 119 lb 14.4 oz (54.386 kg) IBW/kg (Calculated) : 54.7 Heparin Dosing Weight: 54.4 kg  Vital Signs: Temp: 98.3 F (36.8 C) (05/11 2004) Temp Source: Oral (05/11 2004) BP: 143/83 mmHg (05/11 2004) Pulse Rate: 74 (05/11 2004)  Labs:  Recent Labs  11/23/15 1116 11/23/15 2230  HGB 13.7  --   HCT 39.2  --   PLT 138*  --   APTT 32  --   LABPROT 13.2  --   INR 0.98  --   HEPARINUNFRC  --  0.56  CREATININE 0.69  --   TROPONINI <0.03  --     Estimated Creatinine Clearance: 81.1 mL/min (by C-G formula based on Cr of 0.69).   Medications:  Infusions:  . heparin 900 Units/hr (11/23/15 1558)    Assessment: 40 y/o F admitted with LUE DVT. Initiated on Heparin drip at 900 units/hr.  5/11 2230 HL of 0.56 is within therapeutic range.  Goal of Therapy:  Heparin level 0.3-0.7 units/ml Monitor platelets by anticoagulation protocol: Yes   Plan:  Will continue at current rate and recheck HL in 6hrs.  Clovia CuffLisa Elige Shouse, PharmD, BCPS 11/23/2015 11:16 PM

## 2015-11-23 NOTE — ED Notes (Signed)
Pt brought over from US with positive DVT in left arm.

## 2015-11-23 NOTE — ED Notes (Signed)
Pt tearful   meds given.  Pt waiting on admission.  Family with pt

## 2015-11-23 NOTE — ED Notes (Signed)
[place] from [place]  Pt alert.  Sinus brady on monitor.  Family with pt.   Pt waiting on admission.   No sob.  No pain at this time.

## 2015-11-24 ENCOUNTER — Inpatient Hospital Stay
Admit: 2015-11-24 | Discharge: 2015-11-24 | Disposition: A | Discharge status: Home / Self Care | Payer: BLUE CROSS/BLUE SHIELD | Attending: Internal Medicine | Admitting: Internal Medicine

## 2015-11-24 ENCOUNTER — Encounter
Admission: EM | Disposition: A | Discharge status: Home / Self Care | Payer: Self-pay | Source: Home / Self Care | Attending: Internal Medicine

## 2015-11-24 HISTORY — PX: PERIPHERAL VASCULAR CATHETERIZATION: SHX172C

## 2015-11-24 LAB — COMPREHENSIVE METABOLIC PANEL
ALK PHOS: 64 U/L (ref 38–126)
ALT: 10 U/L — ABNORMAL LOW (ref 14–54)
AST: 18 U/L (ref 15–41)
Albumin: 3.5 g/dL (ref 3.5–5.0)
Anion gap: 6 (ref 5–15)
BUN: 7 mg/dL (ref 6–20)
CHLORIDE: 107 mmol/L (ref 101–111)
CO2: 23 mmol/L (ref 22–32)
CREATININE: 0.64 mg/dL (ref 0.44–1.00)
Calcium: 8.6 mg/dL — ABNORMAL LOW (ref 8.9–10.3)
GFR calc Af Amer: >60 mL/min (ref >60)
GFR calc non Af Amer: >60 mL/min (ref >60)
Glucose, Bld: 91 mg/dL (ref 65–99)
Potassium: 3.5 mmol/L (ref 3.5–5.1)
Sodium: 136 mmol/L (ref 135–145)
Total Bilirubin: 0.9 mg/dL (ref 0.3–1.2)
Total Protein: 6.7 g/dL (ref 6.5–8.1)

## 2015-11-24 LAB — TROPONIN I

## 2015-11-24 LAB — PROTIME-INR
INR: 0.97
PROTHROMBIN TIME: 13.1 s (ref 11.4–15.0)

## 2015-11-24 LAB — CBC
HEMATOCRIT: 37.3 % (ref 35.0–47.0)
HEMOGLOBIN: 12.6 g/dL (ref 12.0–16.0)
MCH: 31.5 pg (ref 26.0–34.0)
MCHC: 33.9 g/dL (ref 32.0–36.0)
MCV: 92.9 fL (ref 80.0–100.0)
Platelets: 119 10*3/uL — ABNORMAL LOW (ref 150–440)
RBC: 4.01 MIL/uL (ref 3.80–5.20)
RDW: 13.3 % (ref 11.5–14.5)
WBC: 6.5 10*3/uL (ref 3.6–11.0)

## 2015-11-24 LAB — HEPARIN LEVEL (UNFRACTIONATED)
HEPARIN UNFRACTIONATED: 0.37 [IU]/mL (ref 0.30–0.70)
HEPARIN UNFRACTIONATED: 0.35 [IU]/mL (ref 0.30–0.70)

## 2015-11-24 SURGERY — UPPER EXTREMITY VENOGRAPHY
Anesthesia: Moderate Sedation | Laterality: Left

## 2015-11-24 MED ORDER — HEPARIN (PORCINE) IN NACL 2-0.9 UNIT/ML-% IJ SOLN
INTRAMUSCULAR | Status: AC
  Filled 2015-11-24: qty 1000

## 2015-11-24 MED ORDER — FENTANYL CITRATE (PF) 100 MCG/2ML IJ SOLN
INTRAMUSCULAR | Status: AC
Start: 2015-11-24 — End: 2015-11-24
  Filled 2015-11-24: qty 2

## 2015-11-24 MED ORDER — HEPARIN SODIUM (PORCINE) 1000 UNIT/ML IJ SOLN
INTRAMUSCULAR | Status: DC | PRN
  Administered 2015-11-24: 3000 [IU] via INTRAVENOUS
  Administered 2015-11-24: 2000 [IU] via INTRAVENOUS

## 2015-11-24 MED ORDER — LIDOCAINE HCL (PF) 1 % IJ SOLN
INTRAMUSCULAR | Status: DC | PRN
  Administered 2015-11-24: 5 mL via INTRADERMAL

## 2015-11-24 MED ORDER — NICOTINE 21 MG/24HR TD PT24
21.0000 mg | MEDICATED_PATCH | Freq: Every day | TRANSDERMAL | Status: DC
  Administered 2015-11-24 – 2015-11-25 (×2): 21 mg via TRANSDERMAL
  Filled 2015-11-24 (×2): qty 1

## 2015-11-24 MED ORDER — HEPARIN SODIUM (PORCINE) 1000 UNIT/ML IJ SOLN
INTRAMUSCULAR | Status: AC
  Filled 2015-11-24: qty 1

## 2015-11-24 MED ORDER — MIDAZOLAM HCL 5 MG/5ML IJ SOLN
INTRAMUSCULAR | Status: AC
Start: 2015-11-24 — End: 2015-11-24
  Filled 2015-11-24: qty 5

## 2015-11-24 MED ORDER — HEPARIN (PORCINE) IN NACL 100-0.45 UNIT/ML-% IJ SOLN
900.0000 [IU]/h | INTRAMUSCULAR | Status: DC
  Administered 2015-11-24: 900 [IU]/h via INTRAVENOUS
  Filled 2015-11-24: qty 250

## 2015-11-24 MED ORDER — APIXABAN 5 MG PO TABS: 10.0000 mg | ORAL_TABLET | Freq: Two times a day (BID) | ORAL | Status: DC

## 2015-11-24 MED ORDER — DIPHENHYDRAMINE HCL 50 MG/ML IJ SOLN
INTRAMUSCULAR | Status: AC
  Filled 2015-11-24: qty 1

## 2015-11-24 MED ORDER — APIXABAN 5 MG PO TABS
10.0000 mg | ORAL_TABLET | Freq: Two times a day (BID) | ORAL | Status: DC
  Administered 2015-11-25: 10 mg via ORAL
  Filled 2015-11-24: qty 2

## 2015-11-24 MED ORDER — FENTANYL CITRATE (PF) 100 MCG/2ML IJ SOLN
INTRAMUSCULAR | Status: AC
  Filled 2015-11-24: qty 2

## 2015-11-24 MED ORDER — APIXABAN 5 MG PO TABS: 5.0000 mg | ORAL_TABLET | Freq: Two times a day (BID) | ORAL | Status: DC

## 2015-11-24 MED ORDER — CEFUROXIME SODIUM 1.5 G IJ SOLR
1.5000 g | Freq: Once | INTRAMUSCULAR | Status: AC
  Administered 2015-11-24: 1.5 g via INTRAVENOUS

## 2015-11-24 MED ORDER — ALTEPLASE 2 MG IJ SOLR
INTRAMUSCULAR | Status: DC | PRN
Start: 2015-11-24 — End: 2015-11-24
  Administered 2015-11-24: 10 mg

## 2015-11-24 MED ORDER — TRAZODONE HCL 50 MG PO TABS
50.0000 mg | ORAL_TABLET | Freq: Every day | ORAL | Status: DC
  Administered 2015-11-24 – 2015-11-25 (×2): 50 mg via ORAL
  Filled 2015-11-24 (×2): qty 1

## 2015-11-24 MED ORDER — MIDAZOLAM HCL 2 MG/2ML IJ SOLN
INTRAMUSCULAR | Status: DC | PRN
  Administered 2015-11-24 (×3): 1 mg via INTRAVENOUS
  Administered 2015-11-24: 2 mg via INTRAVENOUS
  Administered 2015-11-24: 1 mg via INTRAVENOUS

## 2015-11-24 MED ORDER — FENTANYL CITRATE (PF) 100 MCG/2ML IJ SOLN
INTRAMUSCULAR | Status: DC | PRN
  Administered 2015-11-24 (×5): 50 ug via INTRAVENOUS

## 2015-11-24 MED ORDER — MIDAZOLAM HCL 2 MG/2ML IJ SOLN
INTRAMUSCULAR | Status: AC
  Filled 2015-11-24: qty 2

## 2015-11-24 MED ORDER — ALPRAZOLAM 0.25 MG PO TABS
0.2500 mg | ORAL_TABLET | Freq: Three times a day (TID) | ORAL | Status: DC | PRN
  Administered 2015-11-24 (×2): 0.25 mg via ORAL
  Filled 2015-11-24 (×2): qty 1

## 2015-11-24 MED ORDER — ALTEPLASE 2 MG IJ SOLR
INTRAMUSCULAR | Status: AC
Start: 2015-11-24 — End: 2015-11-24
  Filled 2015-11-24: qty 10

## 2015-11-24 MED ORDER — DIPHENHYDRAMINE HCL 50 MG/ML IJ SOLN
INTRAMUSCULAR | Status: DC | PRN
Start: 2015-11-24 — End: 2015-11-24
  Administered 2015-11-24: 50 mg via INTRAVENOUS

## 2015-11-24 MED ORDER — LIDOCAINE HCL (PF) 1 % IJ SOLN
INTRAMUSCULAR | Status: AC
  Filled 2015-11-24: qty 30

## 2015-11-24 SURGICAL SUPPLY — 20 items
BALLN DORADO 8X60X80 (BALLOONS) ×3
BALLN DORADO 9X40X80 (BALLOONS) ×3
BALLOON DORADO 8X60X80 (BALLOONS) ×1 IMPLANT
BALLOON DORADO 9X40X80 (BALLOONS) ×1 IMPLANT
CANISTER PENUMBRA MAX (MISCELLANEOUS) ×3 IMPLANT
CATH INDIGO 8 TORQ TIP 85CM (CATHETERS) ×3 IMPLANT
DEVICE PRESTO INFLATION (MISCELLANEOUS) ×3 IMPLANT
DEVICE TORQUE (MISCELLANEOUS) ×3 IMPLANT
DRAPE BRACHIAL (DRAPES) ×3 IMPLANT
GLIDECATH NONTAPER ANGL 5FR (CATHETERS) ×3 IMPLANT
GLIDEWIRE .035X150 LONG TAPER (WIRE) ×3 IMPLANT
NEEDLE ENTRY 21GA 7CM ECHOTIP (NEEDLE) ×3 IMPLANT
PACK ANGIOGRAPHY (CUSTOM PROCEDURE TRAY) ×3 IMPLANT
SET INTRO CAPELLA COAXIAL (SET/KITS/TRAYS/PACK) ×3 IMPLANT
SHEATH BRITE TIP 6FRX5.5 (SHEATH) ×3 IMPLANT
SHEATH BRITE TIP 8FRX11 (SHEATH) ×3 IMPLANT
TOWEL OR 17X26 4PK STRL BLUE (TOWEL DISPOSABLE) ×3 IMPLANT
TUBING ASPIRATION INDIGO (MISCELLANEOUS) ×3 IMPLANT
WIRE G 018X200 V18 (WIRE) ×3 IMPLANT
WIRE MAGIC TOR.035 180C (WIRE) ×3 IMPLANT

## 2015-11-24 NOTE — Progress Notes (Addendum)
ANTICOAGULATION CONSULT NOTE - Initial Consult  Pharmacy Consult for Heparin /  Apixaban  Indication: S/P Thrombectomy  Allergies  Allergen Reactions  . Venlafaxine     Increases Blood Pressure    Patient Measurements: Height: 5\' 4"  (162.6 cm) Weight: 119 lb 14.4 oz (54.386 kg) IBW/kg (Calculated) : 54.7 Heparin Dosing Weight:   Vital Signs: Temp: 98.6 F (37 C) (05/12 2024) Temp Source: Oral (05/12 2024) BP: 113/57 mmHg (05/12 2024) Pulse Rate: 63 (05/12 2024)  Labs:  Recent Labs  11/23/15 1116 11/23/15 2230 11/24/15 0541 11/24/15 2135  HGB 13.7  --  12.6  --   HCT 39.2  --  37.3  --   PLT 138*  --  119*  --   APTT 32  --   --   --   LABPROT 13.2  --  13.1  --   INR 0.98  --  0.97  --   HEPARINUNFRC  --  0.56 0.37 0.35  CREATININE 0.69  --  0.64  --   TROPONINI <0.03 <0.03 <0.03  --     Estimated Creatinine Clearance: 81.1 mL/min (by C-G formula based on Cr of 0.64).   Medical History: Past Medical History  Diagnosis Date  . Hypertension   . Anxiety   . Depression     Medications:  Prescriptions prior to admission  Medication Sig Dispense Refill Last Dose  . cephALEXin (KEFLEX) 500 MG capsule Take 1 capsule (500 mg total) by mouth 2 (two) times daily. 14 capsule 0 11/23/2015 at Unknown time  . hydrochlorothiazide (HYDRODIURIL) 25 MG tablet TAKE 1 TABLET BY MOUTH EVERY DAY 90 tablet 3 11/22/2015 at 1300  . ibuprofen (ADVIL,MOTRIN) 200 MG tablet Take 800 mg by mouth every 8 (eight) hours as needed for mild pain.   11/22/2015 at Unknown time    Assessment: Pharmacy consulted to dose heparin and Eliquis in this 40 year old female S/P thrombectomy.  Pt was previously started on heparin 900 units/hr on 5/11 with therapeutic HL.  Pt underwent thrombectomy on 5/12 (completed around 14:00).  Pt was restarted on heparin 900 units/hr following procedure.  MD wants to transition pt to Eliquis.  Goal of Therapy:  DVT prophylaxis   Plan:  Will d/c heparin gtt and  order Eliquis 10 mg PO BID to start 5/13 @ 8:00 (12 - 18 hrs post procedure) followed by Eliquis 5 mg PO BID to start 5/20 @ 10:00.   5/12:  Spoke with Dr Gilda CreaseSchnier , he wants to continue this pt on heparin after thrombectomy, will continue heparin gtt @ 900 units/hr and d/c on 5/13 @ 7:00.   5/12: HL @ 21:30 = 0.35.  Will continue this pt on current rate of 900 units/hr and recheck HL on 5/13 @ 3:30.   Robbins,Jason D 11/24/2015,10:05 PM   0513 0345 heparin level therapeutic. Continue current rate until discontinued at 0700.   Nylen Creque A. [place], [place]arm.D., BCPS Clinical Pharmacist [phone]

## 2015-11-24 NOTE — Progress Notes (Signed)
*  PRELIMINARY RESULTS* Echocardiogram 2D Echocardiogram has been performed.  Georgann HousekeeperJerry R Hege 11/24/2015, 2:35 PM

## 2015-11-24 NOTE — Progress Notes (Signed)
ANTICOAGULATION CONSULT NOTE - Follow Up Consult  Pharmacy Consult for Heparin Indication: DVT  Allergies  Allergen Reactions  . Venlafaxine     Increases Blood Pressure    Patient Measurements: Height: 5\' 4"  (162.6 cm) Weight: 119 lb 14.4 oz (54.386 kg) IBW/kg (Calculated) : 54.7 Heparin Dosing Weight: 54.4 kg  Vital Signs: Temp: 98.2 F (36.8 C) (05/12 0525) Temp Source: Oral (05/12 0525) BP: 91/53 mmHg (05/12 0525) Pulse Rate: 77 (05/12 0525)  Labs:  Recent Labs  11/23/15 1116 11/23/15 2230 11/24/15 0541  HGB 13.7  --  12.6  HCT 39.2  --  37.3  PLT 138*  --  119*  APTT 32  --   --   LABPROT 13.2  --  13.1  INR 0.98  --  0.97  HEPARINUNFRC  --  0.56 0.37  CREATININE 0.69  --  0.64  TROPONINI <0.03 <0.03 <0.03    Estimated Creatinine Clearance: 81.1 mL/min (by C-G formula based on Cr of 0.64).   Medications:  Infusions:  . heparin 900 Units/hr (11/23/15 1558)    Assessment: 40 y/o F admitted with LUE DVT. Initiated on Heparin drip at 900 units/hr.  5/12 0530 HL of 0.37 is within therapeutic range.  Goal of Therapy:  Heparin level 0.3-0.7 units/ml Monitor platelets by anticoagulation protocol: Yes   Plan:  Will continue at current rate and recheck HL with 5/13 AM labs.  Clovia CuffLisa Tejay Hubert, PharmD, BCPS 11/24/2015 7:05 AM

## 2015-11-24 NOTE — Progress Notes (Addendum)
ANTICOAGULATION CONSULT NOTE - Initial Consult  Pharmacy Consult for Heparin /  Apixaban  Indication: S/P Thrombectomy  Allergies  Allergen Reactions  . Venlafaxine     Increases Blood Pressure    Patient Measurements: Height: 5\' 4"  (162.6 cm) Weight: 119 lb 14.4 oz (54.386 kg) IBW/kg (Calculated) : 54.7 Heparin Dosing Weight:   Vital Signs: Temp: 97.8 F (36.6 C) (05/12 1350) Temp Source: Oral (05/12 1350) BP: 109/74 mmHg (05/12 1350) Pulse Rate: 51 (05/12 1350)  Labs:  Recent Labs  11/23/15 1116 11/23/15 2230 11/24/15 0541  HGB 13.7  --  12.6  HCT 39.2  --  37.3  PLT 138*  --  119*  APTT 32  --   --   LABPROT 13.2  --  13.1  INR 0.98  --  0.97  HEPARINUNFRC  --  0.56 0.37  CREATININE 0.69  --  0.64  TROPONINI <0.03 <0.03 <0.03    Estimated Creatinine Clearance: 81.1 mL/min (by C-G formula based on Cr of 0.64).   Medical History: Past Medical History  Diagnosis Date  . Hypertension   . Anxiety   . Depression     Medications:  Prescriptions prior to admission  Medication Sig Dispense Refill Last Dose  . cephALEXin (KEFLEX) 500 MG capsule Take 1 capsule (500 mg total) by mouth 2 (two) times daily. 14 capsule 0 11/23/2015 at Unknown time  . hydrochlorothiazide (HYDRODIURIL) 25 MG tablet TAKE 1 TABLET BY MOUTH EVERY DAY 90 tablet 3 11/22/2015 at 1300  . ibuprofen (ADVIL,MOTRIN) 200 MG tablet Take 800 mg by mouth every 8 (eight) hours as needed for mild pain.   11/22/2015 at Unknown time    Assessment: Pharmacy consulted to dose heparin and Eliquis in this 40 year old female S/P thrombectomy.  Pt was previously started on heparin 900 units/hr on 5/11 with therapeutic HL.  Pt underwent thrombectomy on 5/12 (completed around 14:00).  Pt was restarted on heparin 900 units/hr following procedure.  MD wants to transition pt to Eliquis.  Goal of Therapy:  DVT prophylaxis   Plan:  Will d/c heparin gtt and order Eliquis 10 mg PO BID to start 5/13 @ 8:00 (12 - 18  hrs post procedure) followed by Eliquis 5 mg PO BID to start 5/20 @ 10:00.   5/12:  Spoke with Dr Gilda CreaseSchnier , he wants to continue this pt on heparin after thrombectomy, will continue heparin gtt @ 900 units/hr and d/c on 5/13 @ 7:00.   Alivea Gladson D 11/24/2015,2:32 PM

## 2015-11-24 NOTE — Care Management (Signed)
Provided patient with a $10 copay coupon for Eliquis.

## 2015-11-24 NOTE — Progress Notes (Signed)
Patient back in room from specials surgical site looks good no signs of bleeding or hematoma noted. Tele box on and verified.VS stable will continue to monitor.

## 2015-11-24 NOTE — Plan of Care (Signed)
Problem: Tissue Perfusion: Goal: Risk factors for ineffective tissue perfusion will decrease Outcome: Progressing Heparin drip

## 2015-11-24 NOTE — Progress Notes (Signed)
Desiree [name] ID: Desiree Desiree [name], female   DOB: 1975-11-19, 40 y.o.   MRN: [id number] [facility]   Desiree [name] NAME: Desiree Desiree [name]    MR#:  [id number]  DATE OF BIRTH:  [birth year]  Came in after persistent left upper extremity swelling tightness and pain. Found to have DVT REVIEW OF S.YSTEMS:   Review of Systems  Constitutional: Negative for fever, chills and weight loss.  HENT: Negative for ear discharge, ear pain and nosebleeds.   Eyes: Negative for blurred vision, pain and discharge.  Respiratory: Negative for sputum production, shortness of breath, wheezing and stridor.   Cardiovascular: Negative for chest pain, palpitations, orthopnea and PND.  Gastrointestinal: Negative for nausea, vomiting, abdominal pain and diarrhea.  Genitourinary: Negative for urgency and frequency.  Musculoskeletal: Negative for back pain and joint pain.       Left upper extremity swelling  Neurological: Negative for sensory change, speech change, focal weakness and weakness.  Psychiatric/Behavioral: Negative for depression and hallucinations. The Desiree [name] is not nervous/anxious.   All other systems reviewed and are negative.  Tolerating Diet:Yes  Tolerating PT:  not needed   DRUG ALLERGIES:   Allergies  Allergen Reactions  . Venlafaxine     Increases Blood Pressure    VITALS:  Blood pressure 122/66, pulse 56, temperature 98.2 F (36.8 C), temperature source Oral, resp. rate 18, height  (1.626 m), weight 54.386 kg (119 lb 14.4 oz), SpO2 98 %.  PHYSICAL EXAMINATION:   Physical Exam  GENERAL:  40 y.o.-year-old Desiree [name] lying in the bed with no acute distress.  EYES: Pupils equal, round, reactive to light and accommodation. No scleral icterus. Extraocular muscles intact.  HEENT: Head atraumatic, normocephalic. Oropharynx and nasopharynx clear.  NECK:  Supple, no jugular venous distention. No thyroid enlargement, no tenderness.  LUNGS: Normal breath sounds  bilaterally, no wheezing, rales, rhonchi. No use of accessory muscles of respiration.  CARDIOVASCULAR: S1, S2 normal. No murmurs, rubs, or gallops.  ABDOMEN: Soft, nontender, nondistended. Bowel sounds present. No organomegaly or mass.  EXTREMITIES: No cyanosis, clubbing or edema b/l.   Left upper extremity swelling with tenderness  NEUROLOGIC: Cranial nerves II through XII are intact. No focal Motor or sensory deficits b/l.   PSYCHIATRIC:  Desiree [name] is alert and oriented x 3.  SKIN: No obvious rash, lesion, or ulcer. Tattoos ++  LABORATORY PANEL:  CBC  Recent Labs Lab 11/24/15 0541  WBC 6.5  HGB 12.6  HCT 37.3  PLT 119*    Chemistries   Recent Labs Lab 11/24/15 0541  NA 136  K 3.5  CL 107  CO2 23  GLUCOSE 91  BUN 7  CREATININE 0.64  CALCIUM 8.6*  AST 18  ALT 10*  ALKPHOS 64  BILITOT 0.9   Cardiac Enzymes  Recent Labs Lab 11/24/15 0541  TROPONINI <0.03   RADIOLOGY:  Ct Angio Chest Pe W/cm &/or Wo Cm  11/23/2015  CLINICAL DATA:  Left arm DVT with shortness of breath and chest pain for 5 days EXAM: CT ANGIOGRAPHY CHEST WITH CONTRAST TECHNIQUE: Multidetector CT imaging of the chest was performed using the standard protocol during bolus administration of intravenous contrast. Multiplanar CT image reconstructions and MIPs were obtained to evaluate the vascular anatomy. CONTRAST:  75 mL Isovue 370. COMPARISON:  None. FINDINGS: Lungs are well aerated bilaterally without focal infiltrate or sizable effusion. The thoracic inlet is within normal limits. Increased density is noted in the left suDENELL COTHERNn and cephalic and axillary veins with  surrounding inflammatory change consistent with the given clinical history of deep venous thrombosis. The thoracic aorta and its branches are within normal limits. The pulmonary artery demonstrates a normal branching pattern without filling defect to suggest pulmonary embolism. No right heart strain is seen. No thrombus is noted within the  atrium or right ventricle. No hilar or mediastinal adenopathy is seen. Visualized upper abdomen shows the gallbladder to be decompressed. No other focal abnormality is seen. The bony structures are within normal limits. Review of the MIP images confirms the above findings. IMPRESSION: No evidence of pulmonary embolism. Changes in the left subclavian vein consistent with the known history of deep venous thrombosis. No other focal abnormality Electronically Signed   By: Alcide Clever M.D.   On: 11/23/2015 13:05   US Venous Img Upper Uni Left  11/23/2015  CLINICAL DATA:  Pain swelling involving the left upper extremity past week. History of smoking. Evaluate for DVT. EXAM: LEFT UPPER EXTREMITY VENOUS DOPPLER ULTRASOUND TECHNIQUE: Gray-scale sonography with graded compression, as well as color Doppler and duplex ultrasound were performed to evaluate the upper extremity deep venous system from the level of the subclavian vein and including the jugular, axillary, basilic, radial, ulnar and upper cephalic vein. Spectral Doppler was utilized to evaluate flow at rest and with distal augmentation maneuvers. COMPARISON:  None. FINDINGS: Contralateral Subclavian Vein: Respiratory phasicity is normal and symmetric with the symptomatic side. No evidence of thrombus. Normal compressibility. Internal Jugular Vein: No evidence of thrombus. Normal compressibility, respiratory phasicity and response to augmentation. There is mixed echogenic DVT within the left subclavian vein. The DVT appears nonocclusive centrally (image 9), though occlusive about its distal aspect (image 13). There is occlusive DVT within the imaged course of the left axillary vein (representative images 15 and 16), extending to involve each of the paired brachial veins (representative images 37 and 40) The left radial ulnar veins appear patent where imaged. There is occlusive SVT within the proximal aspect of the left cephalic vein (representative images 19 and  20). The mid and distal aspects of the cephalic vein appear patent. The basilic vein appears patent were imaged. Other Findings:  None visualized. IMPRESSION: 1. Examination is positive for occlusive DVT extending from the peripheral aspect of the left subclavian vein through both paired brachial veins. 2. Examination is positive for occlusive SVT involving the central aspect of the left cephalic vein. This was made a call report. Electronically Signed   By: Simonne Come M.D.   On: 11/23/2015 10:29   ASSESSMENT AND PLAN:  Krishauna Schatzman is a 40 y.o. female with a known history of Essential hypertension, anxiety and depression was seen by the ED physician approximately a week ago for left arm swelling and tightness which was gradual in onset and constant in nature. She was seen in the ED on 11/17/2015 Desiree [name] was discharged as her labs and x-ray were fine. She was seen by her primary care physician today, he ordered an ultrasound which was positive for left subclavian DVT and Desiree [name] was sent over to the emergency department. ED physician Dr. Inocencio Homes has discussed with on-call vascular surgery Dr. Gilda Crease, who has scheduled her for thrombectomy tomorrow. Heparin drip was initiated in the ED  #Left subclavian, bilateral brachial and left cephalic vein DVT Heparin bolus and drip per pharmacy consult Vascular surgery Dr. Gilda Crease was consulted and scheduled for thrombectomy today Pain management as needed Oncology consult for coag workup Switch to by mouth oral anticoagulation after procedure  #Intermittent episodes of  chest pain and tightness probably from left subclavian DVT -appears atypical pain  #Essential hypertension Resume home medication hydrochlorothiazide and titrate as needed  #history of bipolar disorder not on any medications.  #DVT prophylaxis Desiree [name] is on full dose heparin  Case discussed with Care Management/Social Worker. Management plans discussed with the Desiree [name], family and they  are in agreement.  CODE STATUS:Full    happy Mother's Day Desiree Desiree [name]: 25 minutes.  >50% time spent on counselling and coordination of care  Note: This dictation was prepared with Dragon dictation along with smaller phrase technology. Any transcriptional errors that result from this process are unintentional.  Tarris Delbene M.D on 11/24/2015 at 12:51 PM  Between 7am to 6pm - Pager - [phone]  After 6pm go to [email or web address] - password [email or web address] [facility]  [facility]  Office  [phone]  CC: Primary care physician; Anola Gurneyobert Chauvin, PA

## 2015-11-24 NOTE — Op Note (Signed)
[facility]  Percutaneous Study/Intervention Procedural Note   Date of Surgery: 11/24/2015,3:34 PM  Surgeon:Shelvy Perazzo, Dolores Lory   Pre-operative Diagnosis: Effort thrombosis left arm; left axillary and subclavian DVT  Post-operative diagnosis:  Same  Procedure(s) Performed:  1.  Introduction catheter into superior vena cava left basilic vein approach  2.  Contrast injection left arm venous system  3.  Contrast injection central venous system  4.  Infusion of thrombolytic therapy with 10 mg TPA left arm central venous system  5.  Mechanical thrombectomy left arm venous system using the penumbra  6.  Percutaneous transluminal angioplasty left subclavian to 9 mm    Anesthesia: Conscious sedation was administered under my direct supervision. IV Versed plus fentanyl were utilized. Continuous ECG, pulse oximetry and blood pressure was monitored throughout the entire procedure. Versed and fentanyl were utilized.  Conscious sedation was administered for a total of  120 minutes.  Sheath:  8 French left mid basilic vein  Contrast:  30 cc   Fluoroscopy Time:  8.1 minutes  Indications:  Patient presented to the emergency room yesterday with 1 week of increased swelling and pain in the left upper extremity. She was found to have DVT within the subclavian and axillary veins. The risks and benefits for thrombolysis as well as mechanical thrombectomy and intervention were reviewed all questions of been answered patient wishes to proceed.  Procedure:  Desiree [name] a 40 y.o. female who was identified and appropriate procedural time out was performed.  The patient was then placed supine on the table and prepped and draped in the usual sterile fashion.  Ultrasound was used to evaluate the venous anatomy of the left arm both above and below the antecubital fossa. Both the brachial and basilic veins were noted to be echolucent and compressible indicating patency. The brachial vein  was quite small and went into severe spasm while attempting to access the vein. Therefore the basilic vein which was quite a bit larger was evaluated at the level of the antecubital fossa. It was noted to be echolucent and compressible.  It was patent .  A digital ultrasound image was acquired.  A micropuncture needle was used to access the left basilic vein  under direct ultrasound guidance and a permanent image was saved for the permanent  Record.  A microwire was then advanced under fluoroscopic guidance followed by micro-sheath.  A 0.035 J wire was advanced without resistance and a 6Fr sheath was placed. subsequently the 6 [place] sheath was upsized to an 8 [place] sheath.   2000 units of heparin was given and allowed to circulate. Hand injection contrast through the 8 French sheath was then used to perform venography of the left upper extremity. This did demonstrate thrombus in the proximal axillary vein. Extensive collaterals were noted at the shoulder girdle. However there was no visual position of the central veins.   Using a floppy glide wire and a Kumpe catheter the occluded axillary and subclavian vein were negotiated Kumpe catheter was positioned in the innominate and the superior vena cava and hand injection contrast at these locations was performed demonstrating there was intraluminal positioning with patency of both the superior vena cava as well as the left innominate vein.  10 mg of TPA was reconstituted in 10 cc and using the Kumpe catheter twirling the catheter as the infusion was made the area of thrombosed vein was treated with the lytic therapy. This was then allowed to dwell for 30 minutes.  Follow-up  imaging by hand injection from the 11 cm Pinnacle sheath demonstrated a dramatic reduction in thrombus however there still remained 2 areas 1 at the level of the clavicular head and one at the axillary vein where a large residual thrombus burden was identified. For the most part the medial  and distal thirds of the subclavian vein were now free of thrombus.  The large penumbra was delivered onto the field and prepped. It was then advanced over the wire through the 8 French sheath and multiple passes were made. A total of 300 cc of aspirate was removed. On filtering there were multiple large pieces of thrombotic material present. On hand injection there is now patency of the system and clearly at the level of the subclavian jugular confluence there is a string sign within the subclavian consistent with a stricture identifying a thoracic outlet syndrome.  Initially an 8 x 6 Dorado balloon was advanced across the lesion subsequently a 9 x 4 Dorado balloon. Both inflations were 216 atm for 2 minutes. After the 9 mm balloon inflation there is now patency of the subclavian vein with forward flow through the venous system. The entire venous system of the left arm was then irrigated with approximately 100 cc of heparinized saline. The wire was removed the sheath was pulled and pressure was held.  Findings:   Left Upper Extremity:  Imaging of the left upper extremity is as noted above the veins within the arm from the level of the mid biceps down appear to be widely patent and compressible and free of thrombus. The proximal axillary and the entire subclavian is found to have thrombus and absolute no flow initially. On crossing this lesion the innominate and superior vena cava and noted be widely patent and free of thrombus.  After lytic therapy there is a marked improvement although there remains thrombus at 2 dominant locations. Following removal with the penumbra essentially all visualized portion of thrombus throughout the axillary and subclavian veins of been removed. There is a small thrombus burden remaining in the more proximal brachial veins. Superior vena cava and innominate remained free of thrombus.  Following angioplasty there is now forward flow and adequate luminal gain of the  subclavian.  Summary: Successful thrombectomy with thrombolysis left upper extremity venous system with angioplasty of the culprit subclavian stenosis this is all consistent with effort thrombosis and thoracic outlet syndrome    Disposition: Patient was taken to the recovery room in stable condition having tolerated the procedure well.  Desiree [name], Dolores Lory 11/24/2015,3:34 PM

## 2015-11-25 DIAGNOSIS — Z79899 Other long term (current) drug therapy: Secondary | ICD-10-CM

## 2015-11-25 DIAGNOSIS — I82622 Acute embolism and thrombosis of deep veins of left upper extremity: Secondary | ICD-10-CM

## 2015-11-25 DIAGNOSIS — D6859 Other primary thrombophilia: Secondary | ICD-10-CM

## 2015-11-25 DIAGNOSIS — F17210 Nicotine dependence, cigarettes, uncomplicated: Secondary | ICD-10-CM

## 2015-11-25 DIAGNOSIS — F418 Other specified anxiety disorders: Secondary | ICD-10-CM

## 2015-11-25 DIAGNOSIS — I10 Essential (primary) hypertension: Secondary | ICD-10-CM

## 2015-11-25 DIAGNOSIS — Z7901 Long term (current) use of anticoagulants: Secondary | ICD-10-CM

## 2015-11-25 LAB — CBC
HEMATOCRIT: 33.1 % — AB (ref 35.0–47.0)
HEMOGLOBIN: 11.3 g/dL — AB (ref 12.0–16.0)
MCH: 31.4 pg (ref 26.0–34.0)
MCHC: 34.3 g/dL (ref 32.0–36.0)
MCV: 91.5 fL (ref 80.0–100.0)
Platelets: 126 10*3/uL — ABNORMAL LOW (ref 150–440)
RBC: 3.61 MIL/uL — ABNORMAL LOW (ref 3.80–5.20)
RDW: 13.4 % (ref 11.5–14.5)
WBC: 6 10*3/uL (ref 3.6–11.0)

## 2015-11-25 LAB — ECHOCARDIOGRAM COMPLETE
Height: 64 in
Weight: 1918.4 oz

## 2015-11-25 LAB — HEPARIN LEVEL (UNFRACTIONATED): Heparin Unfractionated: 0.35 IU/mL (ref 0.30–0.70)

## 2015-11-25 MED ORDER — APIXABAN 5 MG PO TABS: ORAL_TABLET | ORAL | Status: DC

## 2015-11-25 MED ORDER — NICOTINE 21 MG/24HR TD PT24: 21.0000 mg | MEDICATED_PATCH | Freq: Every day | TRANSDERMAL | Status: DC

## 2015-11-25 NOTE — Discharge Instructions (Signed)
No heavy lifting for 3-4 weeks

## 2015-11-25 NOTE — Progress Notes (Signed)
Patient d/c'd home. Education provided, no questions at this time. First dose of eliquis given and instructed to take next dose this evening. Patient picked up by significant other. Telemetry removed. Trudee KusterBrandi R Mansfield

## 2015-11-25 NOTE — Discharge Summary (Signed)
[facility] [place] [facility]   PATIENT NAME: Desiree [name]    MR#:  [id number]  DATE OF BIRTH:  08/21/1975  DATE OF ADMISSION:  11/23/2015 ADMITTING PHYSICIAN: Ramonita LabAruna Gouru, MD  DATE OF DISCHARGE: 11/25/15  PRIMARY CARE PHYSICIAN: Anola Gurneyobert Chauvin, PA    ADMISSION DIAGNOSIS:  DVT (deep venous thrombosis), left [I82.402] Chest pain, unspecified chest pain type [R07.9]  DISCHARGE DIAGNOSIS:  Left subclavian,axillary vein DVT s/p thrombectomy HTN Tobacco abuse  SECONDARY DIAGNOSIS:   Past Medical History  Diagnosis Date  . Hypertension   . Anxiety   . Depression     HOSPITAL COURSE:  Desiree [name] is a 40 y.o. female with a known history of Essential hypertension, anxiety and depression was seen by the ED physician approximately a week ago for left arm swelling and tightness which was gradual in onset and constant in nature. She was seen in the ED on 11/17/2015 patient was discharged as her labs and x-ray were fine. She was seen by her primary care physician today, he ordered an ultrasound which was positive for left subclavian DVT and patient was sent over to the emergency department.  #Left subclavian and axillary DVT s/p thrombectomy with thrombolysis left upper extremity venous system with angioplasty of the culprit subclavian stenosis this is all consistent with effort thrombosis and thoracic outlet syndrome  received IV Heparin bolus and drip---now on po eliquis Appreciate Vascular surgery Dr. Gilda Creaseschnier  input Pain management as needed  #Intermittent episodes of chest pain and tightness probably from left subclavian DVT -appears atypical pain  #Essential hypertension Resume home medication hydrochlorothiazide and titrate as needed  #smoking cessation discussed with pt -3 mins spent -agreeable to quit. Patches prescribed  CONSULTS OBTAINED:  Treatment Team:  Earna CoderGovinda R Brahmanday, MD  DRUG ALLERGIES:   Allergies  Allergen Reactions   . Venlafaxine     Increases Blood Pressure    DISCHARGE MEDICATIONS:   Current Discharge Medication List    START taking these medications   Details  apixaban (ELIQUIS) 5 MG TABS tablet Take 2 tabs 2 times a day for 7 days and then 1 tab 2 times a day Qty: 90 tablet, Refills: 2    nicotine (NICODERM CQ - DOSED IN MG/24 HOURS) 21 mg/24hr patch Place 1 patch (21 mg total) onto the skin daily. Qty: 28 patch, Refills: 0      CONTINUE these medications which have NOT CHANGED   Details  hydrochlorothiazide (HYDRODIURIL) 25 MG tablet TAKE 1 TABLET BY MOUTH EVERY DAY Qty: 90 tablet, Refills: 3    ibuprofen (ADVIL,MOTRIN) 200 MG tablet Take 800 mg by mouth every 8 (eight) hours as needed for mild pain.      STOP taking these medications     cephALEXin (KEFLEX) 500 MG capsule         If you experience worsening of your admission symptoms, develop shortness of breath, life threatening emergency, suicidal or homicidal thoughts you must seek medical attention immediately by calling 911 or calling your MD immediately  if symptoms less severe.  You Must read complete instructions/literature along with all the possible adverse reactions/side effects for all the Medicines you take and that have been prescribed to you. Take any new Medicines after you have completely understood and accept all the possible adverse reactions/side effects.   Please note  You were cared for by a hospitalist during your hospital stay. If you have any questions about your discharge medications or the care you received while  you were in the hospital after you are discharged, you can call the unit and asked to speak with the hospitalist on call if the hospitalist that took care of you is not available. Once you are discharged, your primary care physician will handle any further medical issues. Please note that NO REFILLS for any discharge medications will be authorized once you are discharged, as it is imperative that  you return to your primary care physician (or establish a relationship with a primary care physician if you do not have one) for your aftercare needs so that they can reassess your need for medications and monitor your lab values. Today   SUBJECTIVE   Doing well  VITAL SIGNS:  Blood pressure 112/68, pulse 64, temperature 98.1 F (36.7 C), temperature source Oral, resp. rate 18, height  (1.626 m), weight 54.386 kg (119 lb 14.4 oz), SpO2 98 %.  I/O:   Intake/Output Summary (Last 24 hours) at 11/25/15 0724 Last data filed at 11/24/15 1823  Gross per 24 hour  Intake      0 ml  Output    450 ml  Net   -450 ml    PHYSICAL EXAMINATION:  GENERAL:  40 y.o.-year-old patient lying in the bed with no acute distress.  EYES: Pupils equal, round, reactive to light and accommodation. No scleral icterus. Extraocular muscles intact.  HEENT: Head atraumatic, normocephalic. Oropharynx and nasopharynx clear.  NECK:  Supple, no jugular venous distention. No thyroid enlargement, no tenderness.  LUNGS: Normal breath sounds bilaterally, no wheezing, rales,rhonchi or crepitation. No use of accessory muscles of respiration.  CARDIOVASCULAR: S1, S2 normal. No murmurs, rubs, or gallops.  ABDOMEN: Soft, non-tender, non-distended. Bowel sounds present. No organomegaly or mass.  EXTREMITIES: No pedal edema, cyanosis, or clubbing. Left UE swelling improving NEUROLOGIC: Cranial nerves II through XII are intact. Muscle strength 5/5 in all extremities. Sensation intact. Gait not checked.  PSYCHIATRIC: The patient is alert and oriented x 3.  SKIN: No obvious rash, lesion, or ulcer.   DATA REVIEW:   CBC   Recent Labs Lab 11/25/15 0345  WBC 6.0  HGB 11.3*  HCT 33.1*  PLT 126*    Chemistries   Recent Labs Lab 11/24/15 0541  NA 136  K 3.5  CL 107  CO2 23  GLUCOSE 91  BUN 7  CREATININE 0.64  CALCIUM 8.6*  AST 18  ALT 10*  ALKPHOS 64  BILITOT 0.9    Microbiology Results   No results  found for this or any previous visit (from the past 240 hour(s)).  RADIOLOGY:  Ct Angio Chest Pe W/cm &/or Wo Cm  11/23/2015  CLINICAL DATA:  Left arm DVT with shortness of breath and chest pain for 5 days EXAM: CT ANGIOGRAPHY CHEST WITH CONTRAST TECHNIQUE: Multidetector CT imaging of the chest was performed using the standard protocol during bolus administration of intravenous contrast. Multiplanar CT image reconstructions and MIPs were obtained to evaluate the vascular anatomy. CONTRAST:  75 mL Isovue 370. COMPARISON:  None. FINDINGS: Lungs are well aerated bilaterally without focal infiltrate or sizable effusion. The thoracic inlet is within normal limits. Increased density is noted in the left subclavian vein and cephalic and axillary veins with surrounding inflammatory change consistent with the given clinical history of deep venous thrombosis. The thoracic aorta and its branches are within normal limits. The pulmonary artery demonstrates a normal branching pattern without filling defect to suggest pulmonary embolism. No right heart strain is seen. No thrombus is noted within the  atrium or right ventricle. No hilar or mediastinal adenopathy is seen. Visualized upper abdomen shows the gallbladder to be decompressed. No other focal abnormality is seen. The bony structures are within normal limits. Review of the MIP images confirms the above findings. IMPRESSION: No evidence of pulmonary embolism. Changes in the left subclavian vein consistent with the known history of deep venous thrombosis. No other focal abnormality Electronically Signed   By: Alcide Clever M.D.   On: 11/23/2015 13:05   US Venous Img Upper Uni Left  11/23/2015  CLINICAL DATA:  Pain swelling involving the left upper extremity past week. History of smoking. Evaluate for DVT. EXAM: LEFT UPPER EXTREMITY VENOUS DOPPLER ULTRASOUND TECHNIQUE: Gray-scale sonography with graded compression, as well as color Doppler and duplex ultrasound were  performed to evaluate the upper extremity deep venous system from the level of the subclavian vein and including the jugular, axillary, basilic, radial, ulnar and upper cephalic vein. Spectral Doppler was utilized to evaluate flow at rest and with distal augmentation maneuvers. COMPARISON:  None. FINDINGS: Contralateral Subclavian Vein: Respiratory phasicity is normal and symmetric with the symptomatic side. No evidence of thrombus. Normal compressibility. Internal Jugular Vein: No evidence of thrombus. Normal compressibility, respiratory phasicity and response to augmentation. There is mixed echogenic DVT within the left subclavian vein. The DVT appears nonocclusive centrally (image 9), though occlusive about its distal aspect (image 13). There is occlusive DVT within the imaged course of the left axillary vein (representative images 15 and 16), extending to involve each of the paired brachial veins (representative images 37 and 40) The left radial ulnar veins appear patent where imaged. There is occlusive SVT within the proximal aspect of the left cephalic vein (representative images 19 and 20). The mid and distal aspects of the cephalic vein appear patent. The basilic vein appears patent were imaged. Other Findings:  None visualized. IMPRESSION: 1. Examination is positive for occlusive DVT extending from the peripheral aspect of the left subclavian vein through both paired brachial veins. 2. Examination is positive for occlusive SVT involving the central aspect of the left cephalic vein. This was made a call report. Electronically Signed   By: Simonne Come M.D.   On: 11/23/2015 10:29     Management plans discussed with the patient, family and they are in agreement.  CODE STATUS:     Code Status Orders        Start     Ordered   11/23/15 2012  Full code   Continuous     11/23/15 2011    Code Status History    Date Active Date Inactive Code Status Order ID Comments User Context   This patient has  a current code status but no historical code status.      TOTAL TIME TAKING CARE OF THIS PATIENT 40 minutes.    Libbey Duce M.D on 11/25/2015 at 7:24 AM  Between 7am to 6pm - Pager - [phone] After 6pm go to [email or web address] - password [email or web address] [facility]  [place]  Office  [phone]  CC: Primary care physician; Anola Gurney, PA

## 2015-11-25 NOTE — Consult Note (Signed)
[facility] CONSULT NOTE  Patient Care Team: Anola Gurney, [place] as PCP - General (Family Medicine)  CHIEF COMPLAINTS/PURPOSE OF CONSULTATION:  Left upper extremity DVT  HISTORY OF PRESENTING ILLNESS:  Desiree [name] 40 y.o.  female Caucasian patient with a prior history of blood clots presents to the hospital with swelling and pain of her left upper extremity to approximately 1 week. Ultrasound showed extensive DVT of the left upper extremity patient has been evaluated by vascular surgery; underwent thrombolytic therapy yesterday.  Patient denies any birth-control pills or hormone therapy. Denies any family history of blood clots. Denies any miscarriages. She does smoke. However she is motivated to quit smoking.  Patient is previously noted to have tingling and numbness of her upper extremities/fingertips on elevation above her head.   ROS:  No shortness of breath or chest pain. No cough hemoptysis.A complete 10 point review of system is done which is negative except mentioned above in history of present illness  MEDICAL HISTORY:  Past Medical History  Diagnosis Date  . Hypertension   . Anxiety   . Depression     SURGICAL HISTORY: Past Surgical History  Procedure Laterality Date  . Tubal ligation  07/2010    WITH ABLATION  . Uterine ablation      SOCIAL HISTORY: She works in Engineering geologist at [employer]. She lives with her husband; 2 children. Social History   Social History  . Marital Status: Single    Spouse Name: N/A  . Number of Children: N/A  . Years of Education: N/A   Occupational History  . Not on file.   Social History Main Topics  . Smoking status: Current Some Day Smoker -- 0.50 packs/day for 20 years    Types: Cigarettes  . Smokeless tobacco: Never Used  . Alcohol Use: No     Comment: OCCASIONALLY  . Drug Use: No  . Sexual Activity: Not on file   Other Topics Concern  . Not on file   Social History Narrative    FAMILY HISTORY: Family  History  Problem Relation Age of Onset  . Diabetes Mother   . Coronary artery disease Mother   . Heart disease Mother   . Asthma Father     ALLERGIES:  is allergic to venlafaxine.  MEDICATIONS:  Current Facility-Administered Medications  Medication Dose Route Frequency Provider Last Rate Last Dose  . acetaminophen (TYLENOL) tablet 650 mg  650 mg Oral Q6H PRN Ramonita Lab, MD   650 mg at 11/24/15 0739   Or  . acetaminophen (TYLENOL) suppository 650 mg  650 mg Rectal Q6H PRN Ramonita Lab, MD      . ALPRAZolam Prudy Feeler) tablet 0.25 mg  0.25 mg Oral TID PRN Enedina Finner, MD   0.25 mg at 11/24/15 2230  . apixaban (ELIQUIS) tablet 10 mg  10 mg Oral BID Enedina Finner, MD   10 mg at 11/25/15 0946   Followed by  . [START ON 12/02/2015] apixaban (ELIQUIS) tablet 5 mg  5 mg Oral BID Enedina Finner, MD      . docusate sodium (COLACE) capsule 100 mg  100 mg Oral BID Ramonita Lab, MD   100 mg at 11/24/15 2230  . hydrochlorothiazide (HYDRODIURIL) tablet 25 mg  25 mg Oral Daily Ramonita Lab, MD   25 mg at 11/25/15 0946  . nicotine (NICODERM CQ - dosed in mg/24 hours) patch 21 mg  21 mg Transdermal Daily Enedina Finner, MD   21 mg at 11/25/15 0946  .  sodium chloride flush (NS) 0.9 % injection 3 mL  3 mL Intravenous Q12H Ramonita LabAruna Gouru, MD   3 mL at 11/24/15 2200  . traZODone (DESYREL) tablet 50 mg  50 mg Oral QHS Oralia Manisavid Willis, MD   50 mg at 11/25/15 0248      .  PHYSICAL EXAMINATION:   Filed Vitals:   11/25/15 0357 11/25/15 0944  BP: 112/68 110/66  Pulse: 64 58  Temp: 98.1 F (36.7 C)   Resp: 18    Filed Weights   11/23/15 1120 11/23/15 1130 11/23/15 2004  Weight: 120 lb (54.432 kg) 120 lb (54.432 kg) 119 lb 14.4 oz (54.386 kg)    GENERAL: Well-nourished well-developed; Alert, no distress and comfortable.   With her husband.  EYES: no pallor or icterus OROPHARYNX: no thrush or ulceration; good dentition  NECK: supple, no masses felt LYMPH:  no palpable lymphadenopathy in the cervical, axillary or inguinal  regions LUNGS: clear to auscultation and  No wheeze or crackles HEART/CVS: regular rate & rhythm and no murmurs; No lower extremity edema ABDOMEN: abdomen soft, non-tender and normal bowel sounds Musculoskeletal:no cyanosis of digits and no clubbing; LEft upper extremity is swollen/tender/bruise noted[ improvement noted as per patient]. Pulses intact.  PSYCH: alert & oriented x 3 with fluent speech NEURO: no focal motor/sensory deficits SKIN:  no rashes or significant lesions  LABORATORY DATA:  I have reviewed the data as listed Lab Results  Component Value Date   WBC 6.0 11/25/2015   HGB 11.3* 11/25/2015   HCT 33.1* 11/25/2015   MCV 91.5 11/25/2015   PLT 126* 11/25/2015    Recent Labs  11/17/15 1034 11/23/15 1116 11/24/15 0541  NA 138 137 136  K 3.3* 3.5 3.5  CL 103 104 107  CO2 27 25 23   GLUCOSE 91 106* 91  BUN <5* 8 7  CREATININE 0.82 0.69 0.64  CALCIUM 9.2 8.8* 8.6*  GFRNONAA >60 >60 >60  GFRAA >60 >60 >60  PROT  --  7.5 6.7  ALBUMIN  --  4.0 3.5  AST  --  21 18  ALT  --  12* 10*  ALKPHOS  --  76 64  BILITOT  --  1.0 0.9    RADIOGRAPHIC STUDIES: I have personally reviewed the radiological images as listed and agreed with the findings in the report. Dg Chest 2 View  11/17/2015  CLINICAL DATA:  Chest pain, left arm swelling, and chest tightness for 6 days. EXAM: CHEST - 2 VIEW COMPARISON:  None. FINDINGS: The heart size and mediastinal contours are within normal limits. Both lungs are clear. The visualized skeletal structures are unremarkable. IMPRESSION: No active disease. Electronically Signed   By: Marin Robertshristopher  Mattern M.D.   On: 11/17/2015 10:57   Ct Angio Chest Pe W/cm &/or Wo Cm  11/23/2015  CLINICAL DATA:  Left arm DVT with shortness of breath and chest pain for 5 days EXAM: CT ANGIOGRAPHY CHEST WITH CONTRAST TECHNIQUE: Multidetector CT imaging of the chest was performed using the standard protocol during bolus administration of intravenous contrast.  Multiplanar CT image reconstructions and MIPs were obtained to evaluate the vascular anatomy. CONTRAST:  75 mL Isovue 370. COMPARISON:  None. FINDINGS: Lungs are well aerated bilaterally without focal infiltrate or sizable effusion. The thoracic inlet is within normal limits. Increased density is noted in the left subclavian vein and cephalic and axillary veins with surrounding inflammatory change consistent with the given clinical history of deep venous thrombosis. The thoracic aorta and its branches are within  normal limits. The pulmonary artery demonstrates a normal branching pattern without filling defect to suggest pulmonary embolism. No right heart strain is seen. No thrombus is noted within the atrium or right ventricle. No hilar or mediastinal adenopathy is seen. Visualized upper abdomen shows the gallbladder to be decompressed. No other focal abnormality is seen. The bony structures are within normal limits. Review of the MIP images confirms the above findings. IMPRESSION: No evidence of pulmonary embolism. Changes in the left subclavian vein consistent with the known history of deep venous thrombosis. No other focal abnormality Electronically Signed   By: Alcide Clever M.D.   On: 11/23/2015 13:05   US Venous Img Upper Uni Left  11/23/2015  CLINICAL DATA:  Pain swelling involving the left upper extremity past week. History of smoking. Evaluate for DVT. EXAM: LEFT UPPER EXTREMITY VENOUS DOPPLER ULTRASOUND TECHNIQUE: Gray-scale sonography with graded compression, as well as color Doppler and duplex ultrasound were performed to evaluate the upper extremity deep venous system from the level of the subclavian vein and including the jugular, axillary, basilic, radial, ulnar and upper cephalic vein. Spectral Doppler was utilized to evaluate flow at rest and with distal augmentation maneuvers. COMPARISON:  None. FINDINGS: Contralateral Subclavian Vein: Respiratory phasicity is normal and symmetric with the  symptomatic side. No evidence of thrombus. Normal compressibility. Internal Jugular Vein: No evidence of thrombus. Normal compressibility, respiratory phasicity and response to augmentation. There is mixed echogenic DVT within the left subclavian vein. The DVT appears nonocclusive centrally (image 9), though occlusive about its distal aspect (image 13). There is occlusive DVT within the imaged course of the left axillary vein (representative images 15 and 16), extending to involve each of the paired brachial veins (representative images 37 and 40) The left radial ulnar veins appear patent where imaged. There is occlusive SVT within the proximal aspect of the left cephalic vein (representative images 19 and 20). The mid and distal aspects of the cephalic vein appear patent. The basilic vein appears patent were imaged. Other Findings:  None visualized. IMPRESSION: 1. Examination is positive for occlusive DVT extending from the peripheral aspect of the left subclavian vein through both paired brachial veins. 2. Examination is positive for occlusive SVT involving the central aspect of the left cephalic vein. This was made a call report. Electronically Signed   By: Simonne Come M.D.   On: 11/23/2015 10:29    ASSESSMENT & PLAN:   40 year old female patient presenting with left upper extremity swelling/pain noted to have DVT.  # Left upper extremity DVT/extensive needing thrombolytic therapy. Patient swelling. Significant improvement noted post thrombolytic therapy. Patient has been transitioned to Eliquis. Patient tolerating it well. Recommend at least 6 months of anti-coagulation. Discussed with the patient the importance of avoiding falls/high risk situations that could predispose to trauma as unfortunately antidote are not currently available.   # Hypercoagulable state- unclear etiology question thoracic outlet syndrome versus other causes. Recommend outpatient follow-up/workup.   # Discussed regarding  smoking cessation patient is motivated to quit smoking.  Patient follow-up with me in approximately month or so in the office.  The above plan of care was discussed the patient and husband in detail. All which were answered.   Thank you Dr.Patel for allowing me to participate in the care of your pleasant patient. Please do not hesitate to contact me with questions or concerns in the interim.    Earna Coder, MD 11/25/2015 9:49 AM

## 2015-11-27 ENCOUNTER — Telehealth: Payer: Self-pay | Admitting: Family Medicine

## 2015-11-27 ENCOUNTER — Encounter: Payer: Self-pay | Admitting: Vascular Surgery

## 2015-11-27 ENCOUNTER — Other Ambulatory Visit: Payer: Self-pay | Admitting: Family Medicine

## 2015-11-27 DIAGNOSIS — F419 Anxiety disorder, unspecified: Secondary | ICD-10-CM

## 2015-11-27 MED ORDER — CLONAZEPAM 1 MG PO TABS: ORAL_TABLET | ORAL | Status: DC

## 2015-11-27 NOTE — Telephone Encounter (Signed)
Patient states that she has previously seen psych before a year ago when she had medicaid and services were state funded. Patient states she wanted something for anxiety now because of her diagnosis with blood clot, patient states finding that out really scared her and now she is determined to quit smoking as well and wants. KW

## 2015-11-27 NOTE — Telephone Encounter (Signed)
Is she no longer being followed by psychiatry?

## 2015-11-27 NOTE — Telephone Encounter (Signed)
prescription has been called into pharmacy. KW 

## 2015-11-27 NOTE — Telephone Encounter (Signed)
Pt is needing something for anxiety.  They did not give her anything when she left the hospital.  Very emotional.  [facility]  Her call back is [phone].

## 2015-11-27 NOTE — Telephone Encounter (Signed)
Please advise, KW 

## 2015-11-27 NOTE — Telephone Encounter (Signed)
Please call in clonazepam as updated in the EMR.

## 2015-11-30 ENCOUNTER — Encounter: Payer: Self-pay | Admitting: Family Medicine

## 2015-11-30 ENCOUNTER — Ambulatory Visit (INDEPENDENT_AMBULATORY_CARE_PROVIDER_SITE_OTHER): Payer: BLUE CROSS/BLUE SHIELD | Admitting: Family Medicine

## 2015-11-30 VITALS — BP 118/74 | HR 95 | Temp 98.3°F | Resp 16 | Wt 117.4 lb

## 2015-11-30 DIAGNOSIS — N879 Dysplasia of cervix uteri, unspecified: Secondary | ICD-10-CM

## 2015-11-30 DIAGNOSIS — I82622 Acute embolism and thrombosis of deep veins of left upper extremity: Secondary | ICD-10-CM | POA: Insufficient documentation

## 2015-11-30 DIAGNOSIS — R87619 Unspecified abnormal cytological findings in specimens from cervix uteri: Secondary | ICD-10-CM

## 2015-11-30 NOTE — Progress Notes (Signed)
Subjective:     Patient ID: Desiree [name], female   DOB: 10/23/1975, 40 y.o.   MRN: [id number]  HPI  Chief Complaint  Patient presents with  . Hospitalization Follow-up    Patient os present in offie for follow up, patient was [facility]15 with left subcvlavian DVT she was discharged on 11/25/15. Patient has follow up with vascular doctor 12/12/15.   States her arm is improving but tends to get more "tight" over the course of the day. Reports menstrual spotting while on Eliquis in the setting of prior uterine ablation. Concerned that she had an abnormal pap smear with + HPV two years ago at [facility] and did not get worked up further. Does not have an appointment with hematology yet. Reports clonazepam has helped her cope. She has the support of her fiance.   Review of Systems     Objective:   Physical Exam  Constitutional: She appears well-developed and well-nourished. No distress.  Cardiovascular:  Left upper extremity remains mildly swollen with posterior ecchymosis noted.  Psychiatric:  Mildly anxious       Assessment:    1. Abnormal cells of cervix - Ambulatory referral to Gynecology  2. DVT of upper extremity (deep vein thrombosis), left: has vascular f/u 5/30. - Ambulatory referral to Hematology    Plan:    Monitor for abnormal bleeding. She goes down on the dose of Eliquis in two days

## 2015-11-30 NOTE — Patient Instructions (Signed)
Continue to monitor for unusual bleeding.

## 2015-12-12 DIAGNOSIS — M7989 Other specified soft tissue disorders: Secondary | ICD-10-CM | POA: Diagnosis not present

## 2015-12-12 DIAGNOSIS — M79609 Pain in unspecified limb: Secondary | ICD-10-CM | POA: Diagnosis not present

## 2015-12-18 ENCOUNTER — Inpatient Hospital Stay: Payer: BLUE CROSS/BLUE SHIELD | Attending: Oncology | Admitting: Oncology

## 2015-12-18 ENCOUNTER — Inpatient Hospital Stay: Payer: BLUE CROSS/BLUE SHIELD

## 2015-12-18 ENCOUNTER — Encounter (INDEPENDENT_AMBULATORY_CARE_PROVIDER_SITE_OTHER): Payer: Self-pay

## 2015-12-18 VITALS — BP 123/77 | HR 48 | Temp 97.6°F | Resp 16

## 2015-12-18 DIAGNOSIS — Z7901 Long term (current) use of anticoagulants: Secondary | ICD-10-CM

## 2015-12-18 DIAGNOSIS — G540 Brachial plexus disorders: Secondary | ICD-10-CM | POA: Diagnosis not present

## 2015-12-18 DIAGNOSIS — I82622 Acute embolism and thrombosis of deep veins of left upper extremity: Secondary | ICD-10-CM

## 2015-12-18 DIAGNOSIS — I10 Essential (primary) hypertension: Secondary | ICD-10-CM

## 2015-12-18 DIAGNOSIS — Z79899 Other long term (current) drug therapy: Secondary | ICD-10-CM

## 2015-12-18 DIAGNOSIS — F418 Other specified anxiety disorders: Secondary | ICD-10-CM | POA: Diagnosis not present

## 2015-12-18 DIAGNOSIS — I82B12 Acute embolism and thrombosis of left subclavian vein: Secondary | ICD-10-CM | POA: Diagnosis present

## 2015-12-18 DIAGNOSIS — F17210 Nicotine dependence, cigarettes, uncomplicated: Secondary | ICD-10-CM | POA: Diagnosis not present

## 2015-12-18 LAB — ANTITHROMBIN III: AntiThromb III Func: 124 % — ABNORMAL HIGH (ref 75–120)

## 2015-12-18 NOTE — Progress Notes (Signed)
Patient has a recent DVT diagnosis.

## 2015-12-21 ENCOUNTER — Other Ambulatory Visit: Payer: Self-pay | Admitting: Family Medicine

## 2015-12-21 NOTE — Telephone Encounter (Signed)
Refill request

## 2015-12-21 NOTE — Telephone Encounter (Signed)
Rx requesr

## 2015-12-22 LAB — PROTEIN S ACTIVITY: PROTEIN S ACTIVITY: 201 % — AB (ref 63–140)

## 2015-12-22 LAB — PTT-LA MIX: PTT-LA MIX: 50.4 s — AB (ref 0.0–40.6)

## 2015-12-22 LAB — PROTEIN C, TOTAL: Protein C, Total: 97 % (ref 60–150)

## 2015-12-22 LAB — CARDIOLIPIN ANTIBODIES, IGG, IGM, IGA
ANTICARDIOLIPIN IGM: 15 [MPL'U]/mL — AB (ref 0–12)
Anticardiolipin IgA: <9 APL U/mL (ref 0–11)

## 2015-12-22 LAB — PROTEIN C ACTIVITY: Protein C Activity: 128 % (ref 73–180)

## 2015-12-22 LAB — BETA-2-GLYCOPROTEIN I ABS, IGG/M/A
BETA-2-GLYCOPROTEIN I IGM: 10 GPI IgM units (ref 0–32)
Beta-2 Glyco I IgG: <9 GPI IgG units (ref 0–20)
Beta-2-Glycoprotein I IgA: <9 GPI IgA units (ref 0–25)

## 2015-12-22 LAB — DRVVT CONFIRM: DRVVT CONFIRM: 1.1 ratio (ref 0.8–1.2)

## 2015-12-22 LAB — HEXAGONAL PHASE PHOSPHOLIPID: HEXAGONAL PHASE PHOSPHOLIPID: 24 s — AB (ref 0–11)

## 2015-12-22 LAB — PROTEIN S, TOTAL: Protein S Ag, Total: 124 % (ref 60–150)

## 2015-12-22 LAB — LUPUS ANTICOAGULANT PANEL
DRVVT: 66.1 s — AB (ref 0.0–47.0)
PTT Lupus Anticoagulant: 54.6 s — ABNORMAL HIGH (ref 0.0–43.6)

## 2015-12-22 LAB — HOMOCYSTEINE: Homocysteine: 18.2 umol/L — ABNORMAL HIGH (ref 0.0–15.0)

## 2015-12-22 LAB — PROTHROMBIN GENE MUTATION

## 2015-12-22 LAB — FACTOR 5 LEIDEN

## 2015-12-22 LAB — DRVVT MIX: DRVVT MIX: 50.9 s — AB (ref 0.0–47.0)

## 2015-12-23 NOTE — Progress Notes (Signed)
[facility]  Telephone:(336) [phone] Fax:(336) [phone]  ID: Desiree HumphreyCecile E [name] OB: 09/10/1975  MR#: [id number]  AOZ#:[id number]#:[id number]  Patient Care Team: Anola Gurneyobert Chauvin, PA as PCP - General (Family Medicine)  CHIEF COMPLAINT:  Chief Complaint  Patient presents with  . New Evaluation    DVT    INTERVAL HISTORY: Patient is 40 year old female who recently was found to have an extensive left subclavian DVT possibly secondary to vascular abnormality. She is referred to clinic today for hypercoagulable workup and length of anticoagulation. Currently, patient feels well and is asymptomatic. Her left arm pain and swelling have significantly improved. She has no neurologic complaints. She denies any fevers, weight loss, or night sweats. She has no chest pain or shortness of breath. She has no nausea, vomiting, constipation, or diarrhea. She has no urinary complaints. Patient otherwise feels well and offers no further specific complaints.  REVIEW OF SYSTEMS:   Review of Systems  Constitutional: Negative.  Negative for fever, weight loss, malaise/fatigue and diaphoresis.  Respiratory: Negative.  Negative for cough, hemoptysis and shortness of breath.   Cardiovascular: Negative.  Negative for chest pain.  Gastrointestinal: Negative for blood in stool and melena.  Genitourinary: Negative.   Musculoskeletal: Negative.   Neurological: Negative.  Negative for weakness.  Endo/Heme/Allergies: Does not bruise/bleed easily.  Psychiatric/Behavioral: Negative.     As per HPI. Otherwise, a complete review of systems is negatve.  PAST MEDICAL HISTORY: Past Medical History  Diagnosis Date  . Hypertension   . Anxiety   . Depression     PAST SURGICAL HISTORY: Past Surgical History  Procedure Laterality Date  . Tubal ligation  07/2010    WITH ABLATION  . Uterine ablation    . Peripheral vascular catheterization Left 11/24/2015    Procedure: Upper Extremity Venography with possible  intervention and possible thrombolysis;  Surgeon: Renford DillsGregory G Schnier, MD;  [facility]VAS[facility] CV LAB;  Service: Cardiovascular;  Laterality: Left;    FAMILY HISTORY Family History  Problem Relation Age of Onset  . Diabetes Mother   . Coronary artery disease Mother   . Heart disease Mother   . Asthma Father        ADVANCED DIRECTIVES:    HEALTH MAINTENANCE: Social History  Substance Use Topics  . Smoking status: Current Some Day Smoker -- 0.50 packs/day for 20 years    Types: Cigarettes  . Smokeless tobacco: Never Used  . Alcohol Use: No     Comment: OCCASIONALLY     Colonoscopy:  PAP:  Bone density:  Lipid panel:  Allergies  Allergen Reactions  . Venlafaxine     Increases Blood Pressure    Current Outpatient Prescriptions  Medication Sig Dispense Refill  . apixaban (ELIQUIS) 5 MG TABS tablet Take 2 tabs 2 times a day for 7 days and then 1 tab 2 times a day 90 tablet 2  . clonazePAM (KLONOPIN) 1 MG tablet 1/2 to one twice daily as needed for anxiety 30 tablet 0  . hydrochlorothiazide (HYDRODIURIL) 25 MG tablet TAKE 1 TABLET BY MOUTH EVERY DAY 90 tablet 3  . ibuprofen (ADVIL,MOTRIN) 200 MG tablet Take 800 mg by mouth every 8 (eight) hours as needed for mild pain.    Marland Kitchen. NICOTINE STEP 1 21 MG/24HR patch PLACE 1 PATCH (21 MG TOTAL) ONTO THE SKIN DAILY. 28 patch 1   No current facility-administered medications for this visit.    OBJECTIVE: Filed Vitals:   12/18/15 0949  BP: 123/77  Pulse: 48  Temp: 97.6  F (36.4 C)  Resp: 16     There is no weight on file to calculate BMI.    ECOG FS:0 - Asymptomatic  General: Well-developed, well-nourished, no acute distress. Eyes: Pink conjunctiva, anicteric sclera. HEENT: Normocephalic, moist mucous membranes, clear oropharnyx. Lungs: Clear to auscultation bilaterally. Heart: Regular rate and rhythm. No rubs, murmurs, or gallops. Abdomen: Soft, nontender, nondistended. No organomegaly noted, normoactive bowel  sounds. Musculoskeletal: No edema, cyanosis, or clubbing. Neuro: Alert, answering all questions appropriately. Cranial nerves grossly intact. Skin: No rashes or petechiae noted. Psych: Normal affect. Lymphatics: No cervical, calvicular, axillary or inguinal LAD.   LAB RESULTS:  Lab Results  Component Value Date   NA 136 11/24/2015   K 3.5 11/24/2015   CL 107 11/24/2015   CO2 23 11/24/2015   GLUCOSE 91 11/24/2015   BUN 7 11/24/2015   CREATININE 0.64 11/24/2015   CALCIUM 8.6* 11/24/2015   PROT 6.7 11/24/2015   ALBUMIN 3.5 11/24/2015   AST 18 11/24/2015   ALT 10* 11/24/2015   ALKPHOS 64 11/24/2015   BILITOT 0.9 11/24/2015   GFRNONAA >60 11/24/2015   GFRAA >60 11/24/2015    Lab Results  Component Value Date   WBC 6.0 11/25/2015   NEUTROABS 6.3 11/23/2015   HGB 11.3* 11/25/2015   HCT 33.1* 11/25/2015   MCV 91.5 11/25/2015   PLT 126* 11/25/2015     STUDIES: No results found.  ASSESSMENT: Upper extremity DVT, possibly secondary to vascular abnormality.  PLAN:    1. Upper extremity DVT: Patient underwent thrombectomy while in the hospital with significant improvement of her pain and swelling.  Hypercoagulable workup from today is essentially negative. She does have a positive dRVVT which is likely secondary to being on Eliquis. Agree with previous recommendation of 6 months of anticoagulation. Possible etiology of patient's blood clot is secondary to her thoracic outlet syndrome and she has been instructed to keep her follow-up appointment with vascular surgery as scheduled. Return to clinic in 3 months for further evaluation.  Approximately 30 minutes was spent in discussion of which greater than 50% consultation.  Patient expressed understanding and was in agreement with this plan. She also understands that She can call clinic at any time with any questions, concerns, or complaints.     Jeralyn Ruths, MD   12/23/2015 1:35 PM

## 2015-12-25 ENCOUNTER — Telehealth: Payer: Self-pay | Admitting: *Deleted

## 2015-12-25 DIAGNOSIS — N72 Inflammatory disease of cervix uteri: Secondary | ICD-10-CM | POA: Diagnosis not present

## 2015-12-25 DIAGNOSIS — N871 Moderate cervical dysplasia: Secondary | ICD-10-CM | POA: Diagnosis not present

## 2015-12-25 DIAGNOSIS — B977 Papillomavirus as the cause of diseases classified elsewhere: Secondary | ICD-10-CM | POA: Diagnosis not present

## 2015-12-25 NOTE — Telephone Encounter (Signed)
See my clinic note.  Thanks!

## 2015-12-25 NOTE — Telephone Encounter (Signed)
Patient notified

## 2015-12-25 NOTE — Telephone Encounter (Signed)
Patient called in to request results from labs drawn last week.  Please advise.

## 2016-01-02 DIAGNOSIS — N930 Postcoital and contact bleeding: Secondary | ICD-10-CM | POA: Diagnosis not present

## 2016-01-02 DIAGNOSIS — N760 Acute vaginitis: Secondary | ICD-10-CM | POA: Diagnosis not present

## 2016-01-02 DIAGNOSIS — N898 Other specified noninflammatory disorders of vagina: Secondary | ICD-10-CM | POA: Diagnosis not present

## 2016-01-02 DIAGNOSIS — B9689 Other specified bacterial agents as the cause of diseases classified elsewhere: Secondary | ICD-10-CM | POA: Diagnosis not present

## 2016-01-03 ENCOUNTER — Telehealth: Payer: Self-pay | Admitting: Family Medicine

## 2016-01-03 ENCOUNTER — Other Ambulatory Visit: Payer: Self-pay | Admitting: Family Medicine

## 2016-01-03 DIAGNOSIS — F419 Anxiety disorder, unspecified: Secondary | ICD-10-CM

## 2016-01-03 MED ORDER — CLONAZEPAM 0.5 MG PO TABS: ORAL_TABLET | ORAL | Status: DC

## 2016-01-03 NOTE — Telephone Encounter (Signed)
Please call in clonazepam at dose updated in the EMR.

## 2016-01-03 NOTE — Telephone Encounter (Signed)
Called to pharmacy  ED 

## 2016-01-03 NOTE — Telephone Encounter (Signed)
Pt needs refill on her clonazePAM (KLONOPIN) 1 MG tablet but she wants to know if there is a lower dose she can start using.  She wants to try eventually to wean off of it.  [facility]  Her call back is [phone]  Thanks, Barth Kirksteri

## 2016-01-22 DIAGNOSIS — M79609 Pain in unspecified limb: Secondary | ICD-10-CM | POA: Diagnosis not present

## 2016-01-22 DIAGNOSIS — I82629 Acute embolism and thrombosis of deep veins of unspecified upper extremity: Secondary | ICD-10-CM | POA: Diagnosis not present

## 2016-01-22 DIAGNOSIS — G540 Brachial plexus disorders: Secondary | ICD-10-CM | POA: Diagnosis not present

## 2016-01-22 DIAGNOSIS — M7989 Other specified soft tissue disorders: Secondary | ICD-10-CM | POA: Diagnosis not present

## 2016-02-09 ENCOUNTER — Other Ambulatory Visit: Payer: Self-pay | Admitting: Family Medicine

## 2016-02-09 DIAGNOSIS — F419 Anxiety disorder, unspecified: Secondary | ICD-10-CM

## 2016-02-09 NOTE — Telephone Encounter (Signed)
Prescription has been called into pharmacy. KW 

## 2016-02-09 NOTE — Telephone Encounter (Signed)
Please review refill request 

## 2016-03-18 ENCOUNTER — Other Ambulatory Visit: Payer: Self-pay | Admitting: Family Medicine

## 2016-03-19 ENCOUNTER — Ambulatory Visit: Payer: BLUE CROSS/BLUE SHIELD | Admitting: Oncology

## 2016-03-25 NOTE — Progress Notes (Signed)
[facility]  Telephone:(336) [phone] Fax:(336) [phone]  ID: Desiree HumphreyCecile E Boot OB: 06/07/1976  MR#: [id number]  AOZ#:[id number]#:[id number]  Patient Care Team: Anola Gurneyobert Chauvin, PA as PCP - General (Family Medicine)  CHIEF COMPLAINT: Left upper extremity DVT, possibly secondary to vascular abnormality.  INTERVAL HISTORY: Patient returns to clinic for further evaluation and discussion of whether or not to continue anticoagulation.  She continues to have left shoulder pain.  She is anxious. She otherwise feels well. She has no neurologic complaints. She denies any fevers, weight loss, or night sweats. She has no chest pain or shortness of breath. She has no nausea, vomiting, constipation, or diarrhea. She has no urinary complaints. Patient otherwise feels well and offers no further specific complaints.  REVIEW OF SYSTEMS:   Review of Systems  Constitutional: Negative.  Negative for diaphoresis, fever, malaise/fatigue and weight loss.  Respiratory: Negative.  Negative for cough, hemoptysis and shortness of breath.   Cardiovascular: Negative.  Negative for chest pain.  Gastrointestinal: Negative for blood in stool and melena.  Genitourinary: Negative.   Musculoskeletal: Positive for joint pain.  Neurological: Negative.  Negative for weakness.  Endo/Heme/Allergies: Does not bruise/bleed easily.  Psychiatric/Behavioral: The patient is nervous/anxious.     As per HPI. Otherwise, a complete review of systems is negative.  PAST MEDICAL HISTORY: Past Medical History:  Diagnosis Date  . Anxiety   . Depression   . Hypertension     PAST SURGICAL HISTORY: Past Surgical History:  Procedure Laterality Date  . PERIPHERAL VASCULAR CATHETERIZATION Left 11/24/2015   Procedure: Upper Extremity Venography with possible intervention and possible thrombolysis;  Surgeon: Renford DillsGregory G Schnier, MD;  [facility] INVASIVE CV LAB;  Service: Cardiovascular;  Laterality: Left;  . TUBAL LIGATION  07/2010   WITH ABLATION  . uterine ablation      FAMILY HISTORY Family History  Problem Relation Age of Onset  . Diabetes Mother   . Coronary artery disease Mother   . Heart disease Mother   . Asthma Father        ADVANCED DIRECTIVES:    HEALTH MAINTENANCE: Social History  Substance Use Topics  . Smoking status: Current Some Day Smoker    Packs/day: 0.50    Years: 20.00    Types: Cigarettes  . Smokeless tobacco: Never Used  . Alcohol use No     Comment: OCCASIONALLY     Colonoscopy:  PAP:  Bone density:  Lipid panel:  Allergies  Allergen Reactions  . Venlafaxine     Increases Blood Pressure    Current Outpatient Prescriptions  Medication Sig Dispense Refill  . apixaban (ELIQUIS) 5 MG TABS tablet Take 1 tablet (5 mg total) by mouth 2 (two) times daily. 60 tablet 2  . clonazePAM (KLONOPIN) 0.5 MG tablet TAKE 1/2 TO 1 TABLET BY MOUTH TWICE A DAY AS NEEDED FOR ANXIETY 30 tablet 2  . hydrochlorothiazide (HYDRODIURIL) 25 MG tablet TAKE 1 TABLET BY MOUTH EVERY DAY 90 tablet 3  . ibuprofen (ADVIL,MOTRIN) 200 MG tablet Take 800 mg by mouth every 8 (eight) hours as needed for mild pain.    . metroNIDAZOLE (METROGEL) 0.75 % vaginal gel INSERT 1 APPLICATORFUL VAGINALLY TWICE WEEKLY FOR 12 WEEKS  2   No current facility-administered medications for this visit.     OBJECTIVE: Vitals:   03/26/16 1502  BP: 126/83  Pulse: 75  Resp: 18  Temp: 98.2 F (36.8 C)     Body mass index is 20.25 kg/m.    ECOG FS:0 -  Asymptomatic  General: Well-developed, well-nourished, no acute distress. Eyes: Pink conjunctiva, anicteric sclera. Lungs: Clear to auscultation bilaterally. Heart: Regular rate and rhythm. No rubs, murmurs, or gallops. Abdomen: Soft, nontender, nondistended. No organomegaly noted, normoactive bowel sounds. Musculoskeletal: No edema, cyanosis, or clubbing. Neuro: Alert, answering all questions appropriately. Cranial nerves grossly intact. Skin: No rashes or petechiae  noted. Psych: Normal affect.  LAB RESULTS:  Lab Results  Component Value Date   NA 136 11/24/2015   K 3.5 11/24/2015   CL 107 11/24/2015   CO2 23 11/24/2015   GLUCOSE 91 11/24/2015   BUN 7 11/24/2015   CREATININE 0.64 11/24/2015   CALCIUM 8.6 (L) 11/24/2015   PROT 6.7 11/24/2015   ALBUMIN 3.5 11/24/2015   AST 18 11/24/2015   ALT 10 (L) 11/24/2015   ALKPHOS 64 11/24/2015   BILITOT 0.9 11/24/2015   GFRNONAA >60 11/24/2015   GFRAA >60 11/24/2015    Lab Results  Component Value Date   WBC 6.0 11/25/2015   NEUTROABS 6.3 11/23/2015   HGB 11.3 (L) 11/25/2015   HCT 33.1 (L) 11/25/2015   MCV 91.5 11/25/2015   PLT 126 (L) 11/25/2015     STUDIES: No results found.  ASSESSMENT: Left upper extremity DVT, possibly secondary to vascular abnormality.  PLAN:    1. Left upper extremity DVT: Patient underwent thrombectomy while in the hospital with significant improvement of her pain and swelling.  Hypercoagulable workup is essentially negative. She does have a positive dRVVT which is likely secondary to being on Eliquis. Patient has a subclavian stricture possibly causing thoracic outlet syndrome. She has been evaluated by vascular surgery, but she is unclear of any further follow up.  After lengthy discussion with the patient, it was decided to remain on chronic Eliquis until she has a full evaluation of her possible thoracic outlet syndrome. No follow up has been scheduled at this time.  Approximately 30 minutes was spent in discussion of which greater than 50% consultation.  Patient expressed understanding and was in agreement with this plan. She also understands that She can call clinic at any time with any questions, concerns, or complaints.     Jeralyn Ruths, MD   03/26/2016 9:27 PM

## 2016-03-26 ENCOUNTER — Inpatient Hospital Stay: Payer: BLUE CROSS/BLUE SHIELD | Attending: Oncology | Admitting: Oncology

## 2016-03-26 VITALS — BP 126/83 | HR 75 | Temp 98.2°F | Resp 18 | Wt 117.9 lb

## 2016-03-26 DIAGNOSIS — M25512 Pain in left shoulder: Secondary | ICD-10-CM | POA: Insufficient documentation

## 2016-03-26 DIAGNOSIS — Z7901 Long term (current) use of anticoagulants: Secondary | ICD-10-CM | POA: Diagnosis not present

## 2016-03-26 DIAGNOSIS — F17210 Nicotine dependence, cigarettes, uncomplicated: Secondary | ICD-10-CM | POA: Diagnosis not present

## 2016-03-26 DIAGNOSIS — I82622 Acute embolism and thrombosis of deep veins of left upper extremity: Secondary | ICD-10-CM | POA: Diagnosis not present

## 2016-03-26 DIAGNOSIS — F419 Anxiety disorder, unspecified: Secondary | ICD-10-CM | POA: Diagnosis not present

## 2016-03-26 DIAGNOSIS — Z79899 Other long term (current) drug therapy: Secondary | ICD-10-CM | POA: Diagnosis not present

## 2016-03-26 DIAGNOSIS — I10 Essential (primary) hypertension: Secondary | ICD-10-CM | POA: Diagnosis not present

## 2016-03-26 NOTE — Progress Notes (Signed)
Patient here today regarding follow up for DVT in upper left extremity.  States she has intermittent pain in the left arm, left neck and left chest area.  No SOB.  States she has not felt well since Friday.

## 2016-04-05 ENCOUNTER — Encounter: Payer: Self-pay | Admitting: Family Medicine

## 2016-04-05 ENCOUNTER — Ambulatory Visit (INDEPENDENT_AMBULATORY_CARE_PROVIDER_SITE_OTHER): Payer: BLUE CROSS/BLUE SHIELD | Admitting: Family Medicine

## 2016-04-05 VITALS — BP 110/78 | HR 68 | Temp 98.4°F | Resp 15 | Wt 114.2 lb

## 2016-04-05 DIAGNOSIS — Z658 Other specified problems related to psychosocial circumstances: Secondary | ICD-10-CM

## 2016-04-05 DIAGNOSIS — F439 Reaction to severe stress, unspecified: Secondary | ICD-10-CM

## 2016-04-05 MED ORDER — HYDROXYZINE HCL 25 MG PO TABS: 25.0000 mg | ORAL_TABLET | Freq: Three times a day (TID) | ORAL | 0 refills | Status: DC | PRN

## 2016-04-05 NOTE — Patient Instructions (Signed)
Call if not improving. Do make appointment with the vascular surgeon.

## 2016-04-05 NOTE — Progress Notes (Signed)
Subjective:     Patient ID: Lynford Humphreyecile E Engelstad, female   DOB: 08/23/1975, 40 y.o.   MRN: [id number]  HPI  Chief Complaint  Patient presents with  . Shoulder Pain    Patient comes in office today to address chronic shoulder pain since May. Patient sttes that she was told by specialist that she still has trauma  and clotting of vein.   States she just received the name of a surgical vascular specialist to further evaluate her thoracic outlet syndrome. Currently taking 1/2 clonazepam daily but admits to increased stress worrying about her vascular problem. Remains on anticoagulation until a more definitive procedure can be accomplished. 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   Review of Systems     Objective:   Physical Exam  Constitutional: She appears well-developed and well-nourished. No distress.       Assessment:    1. Situational stress: patient wishes to minimize clonazepam use - hydrOXYzine (ATARAX/VISTARIL) 25 MG tablet; Take 1 tablet (25 mg total) by mouth 3 (three) times daily as needed. For stress/anxiety  Dispense: 30 tablet; Refill: 0    Plan:   F/u with vascular specialist. Call if hydroxyzine not helping.

## 2016-04-12 ENCOUNTER — Other Ambulatory Visit: Payer: Self-pay | Admitting: *Deleted

## 2016-04-15 ENCOUNTER — Telehealth: Payer: Self-pay | Admitting: *Deleted

## 2016-04-15 NOTE — Telephone Encounter (Signed)
Patient left message stating she has more questions regarding [facility], attempted to call patient back with no answer. Left vm message for patient to return call.

## 2016-04-25 ENCOUNTER — Telehealth: Payer: Self-pay

## 2016-04-25 DIAGNOSIS — G540 Brachial plexus disorders: Secondary | ICD-10-CM | POA: Diagnosis not present

## 2016-04-25 DIAGNOSIS — R200 Anesthesia of skin: Secondary | ICD-10-CM | POA: Diagnosis not present

## 2016-04-25 DIAGNOSIS — R202 Paresthesia of skin: Secondary | ICD-10-CM | POA: Diagnosis not present

## 2016-04-25 DIAGNOSIS — I871 Compression of vein: Secondary | ICD-10-CM | POA: Diagnosis not present

## 2016-04-25 DIAGNOSIS — I10 Essential (primary) hypertension: Secondary | ICD-10-CM | POA: Diagnosis not present

## 2016-04-25 DIAGNOSIS — M7989 Other specified soft tissue disorders: Secondary | ICD-10-CM | POA: Diagnosis not present

## 2016-04-25 DIAGNOSIS — Z87442 Personal history of urinary calculi: Secondary | ICD-10-CM | POA: Diagnosis not present

## 2016-04-25 DIAGNOSIS — I82B12 Acute embolism and thrombosis of left subclavian vein: Secondary | ICD-10-CM | POA: Diagnosis not present

## 2016-04-25 DIAGNOSIS — Z7901 Long term (current) use of anticoagulants: Secondary | ICD-10-CM | POA: Diagnosis not present

## 2016-04-25 DIAGNOSIS — I82A12 Acute embolism and thrombosis of left axillary vein: Secondary | ICD-10-CM | POA: Diagnosis not present

## 2016-04-25 NOTE — Telephone Encounter (Signed)
Patient is requesting Desiree [name] call her back to discuss current medication condition. Patient wanted to touch base with him and advise him about a upcoming surgery. Patient is aware Desiree [name] is out of the office this week.  [phone]

## 2016-04-26 NOTE — Telephone Encounter (Signed)
Called patient and advised that you will return to office Monday, I asked her if this was a matter that needed to be addressed by another MD/physican today and she stated that she is having major surgery in a week and did not want to discuss with me. Patient would like for you to contact her back when you return to office. KW

## 2016-04-30 ENCOUNTER — Other Ambulatory Visit: Payer: Self-pay | Admitting: Family Medicine

## 2016-04-30 DIAGNOSIS — F439 Reaction to severe stress, unspecified: Secondary | ICD-10-CM

## 2016-04-30 MED ORDER — HYDROXYZINE HCL 25 MG PO TABS: 25.0000 mg | ORAL_TABLET | Freq: Three times a day (TID) | ORAL | 5 refills | Status: DC | PRN

## 2016-04-30 NOTE — Telephone Encounter (Signed)
States she is having surgery for [facility]. States she is currently on ? Lovenox and oral anticoagulant.

## 2016-05-01 DIAGNOSIS — I82722 Chronic embolism and thrombosis of deep veins of left upper extremity: Secondary | ICD-10-CM | POA: Diagnosis not present

## 2016-05-01 DIAGNOSIS — G540 Brachial plexus disorders: Secondary | ICD-10-CM | POA: Diagnosis not present

## 2016-05-02 DIAGNOSIS — I82B29 Chronic embolism and thrombosis of unspecified subclavian vein: Secondary | ICD-10-CM | POA: Diagnosis not present

## 2016-05-02 DIAGNOSIS — G540 Brachial plexus disorders: Secondary | ICD-10-CM | POA: Diagnosis not present

## 2016-05-02 DIAGNOSIS — Z6821 Body mass index (BMI) 21.0-21.9, adult: Secondary | ICD-10-CM | POA: Diagnosis not present

## 2016-05-02 DIAGNOSIS — I82A22 Chronic embolism and thrombosis of left axillary vein: Secondary | ICD-10-CM | POA: Diagnosis not present

## 2016-05-02 DIAGNOSIS — Z01810 Encounter for preprocedural cardiovascular examination: Secondary | ICD-10-CM | POA: Diagnosis not present

## 2016-05-13 DIAGNOSIS — Z720 Tobacco use: Secondary | ICD-10-CM | POA: Diagnosis not present

## 2016-05-13 DIAGNOSIS — F419 Anxiety disorder, unspecified: Secondary | ICD-10-CM | POA: Diagnosis not present

## 2016-05-13 DIAGNOSIS — I10 Essential (primary) hypertension: Secondary | ICD-10-CM | POA: Diagnosis not present

## 2016-05-13 DIAGNOSIS — Z01818 Encounter for other preprocedural examination: Secondary | ICD-10-CM | POA: Diagnosis not present

## 2016-05-14 DIAGNOSIS — Z01818 Encounter for other preprocedural examination: Secondary | ICD-10-CM | POA: Diagnosis not present

## 2016-05-14 DIAGNOSIS — I82B22 Chronic embolism and thrombosis of left subclavian vein: Secondary | ICD-10-CM | POA: Diagnosis not present

## 2016-05-14 DIAGNOSIS — J939 Pneumothorax, unspecified: Secondary | ICD-10-CM | POA: Diagnosis not present

## 2016-05-14 DIAGNOSIS — F419 Anxiety disorder, unspecified: Secondary | ICD-10-CM | POA: Diagnosis not present

## 2016-05-14 DIAGNOSIS — F329 Major depressive disorder, single episode, unspecified: Secondary | ICD-10-CM | POA: Diagnosis not present

## 2016-05-14 DIAGNOSIS — I82A22 Chronic embolism and thrombosis of left axillary vein: Secondary | ICD-10-CM | POA: Diagnosis not present

## 2016-05-14 DIAGNOSIS — R918 Other nonspecific abnormal finding of lung field: Secondary | ICD-10-CM | POA: Diagnosis not present

## 2016-05-14 DIAGNOSIS — I10 Essential (primary) hypertension: Secondary | ICD-10-CM | POA: Diagnosis not present

## 2016-05-14 DIAGNOSIS — F17210 Nicotine dependence, cigarettes, uncomplicated: Secondary | ICD-10-CM | POA: Diagnosis not present

## 2016-05-14 DIAGNOSIS — G540 Brachial plexus disorders: Secondary | ICD-10-CM | POA: Diagnosis not present

## 2016-05-14 DIAGNOSIS — Z716 Tobacco abuse counseling: Secondary | ICD-10-CM | POA: Diagnosis not present

## 2016-05-15 DIAGNOSIS — G540 Brachial plexus disorders: Secondary | ICD-10-CM | POA: Diagnosis not present

## 2016-05-15 DIAGNOSIS — Z01818 Encounter for other preprocedural examination: Secondary | ICD-10-CM | POA: Diagnosis not present

## 2016-05-15 DIAGNOSIS — R918 Other nonspecific abnormal finding of lung field: Secondary | ICD-10-CM | POA: Diagnosis not present

## 2016-05-17 ENCOUNTER — Telehealth: Payer: Self-pay | Admitting: Family Medicine

## 2016-05-17 NOTE — Telephone Encounter (Signed)
FYI. KW 

## 2016-05-17 NOTE — Telephone Encounter (Signed)
Pt is being discharged today from [facility] for Thoracic out put syndrome.  I did request records faxed.  I have scheduled a hospital follow up/MW

## 2016-05-23 DIAGNOSIS — J90 Pleural effusion, not elsewhere classified: Secondary | ICD-10-CM | POA: Diagnosis not present

## 2016-05-23 DIAGNOSIS — G540 Brachial plexus disorders: Secondary | ICD-10-CM | POA: Diagnosis not present

## 2016-05-27 ENCOUNTER — Inpatient Hospital Stay: Payer: BLUE CROSS/BLUE SHIELD | Admitting: Family Medicine

## 2016-06-19 ENCOUNTER — Other Ambulatory Visit: Payer: Self-pay | Admitting: Family Medicine

## 2016-06-20 DIAGNOSIS — Z9889 Other specified postprocedural states: Secondary | ICD-10-CM | POA: Diagnosis not present

## 2016-06-20 DIAGNOSIS — I82B29 Chronic embolism and thrombosis of unspecified subclavian vein: Secondary | ICD-10-CM | POA: Diagnosis not present

## 2016-06-20 DIAGNOSIS — G540 Brachial plexus disorders: Secondary | ICD-10-CM | POA: Diagnosis not present

## 2016-06-20 DIAGNOSIS — I871 Compression of vein: Secondary | ICD-10-CM | POA: Diagnosis not present

## 2016-06-20 DIAGNOSIS — I82A29 Chronic embolism and thrombosis of unspecified axillary vein: Secondary | ICD-10-CM | POA: Diagnosis not present

## 2016-06-21 ENCOUNTER — Telehealth: Payer: Self-pay | Admitting: Family Medicine

## 2016-06-21 ENCOUNTER — Other Ambulatory Visit: Payer: Self-pay | Admitting: Family Medicine

## 2016-06-21 DIAGNOSIS — F419 Anxiety disorder, unspecified: Secondary | ICD-10-CM

## 2016-06-21 MED ORDER — CLONAZEPAM 0.5 MG PO TABS: ORAL_TABLET | ORAL | 2 refills | Status: DC

## 2016-06-21 NOTE — Telephone Encounter (Signed)
See below. KW 

## 2016-06-21 NOTE — Telephone Encounter (Signed)
Please review. KW 

## 2016-06-21 NOTE — Telephone Encounter (Signed)
Pt needs refill on   clonazePAM (KLONOPIN) 0.5 MG tablet   Pt is out and would like a refill today.  [facility]  ThanksTeri

## 2016-06-21 NOTE — Progress Notes (Signed)
Med called in

## 2016-07-07 IMAGING — US ABDOMEN ULTRASOUND
1 series · 14 of 25 positions shown · non-contrast
Comparison: 07/24/2012 abdominal CT

CLINICAL DATA: Right upper quadrant pain

EXAM:
ULTRASOUND ABDOMEN COMPLETE

[Series 1: abdomen ultrasound · 0.15mm/px · 14 of 93 slices shown]
[im 1/93]
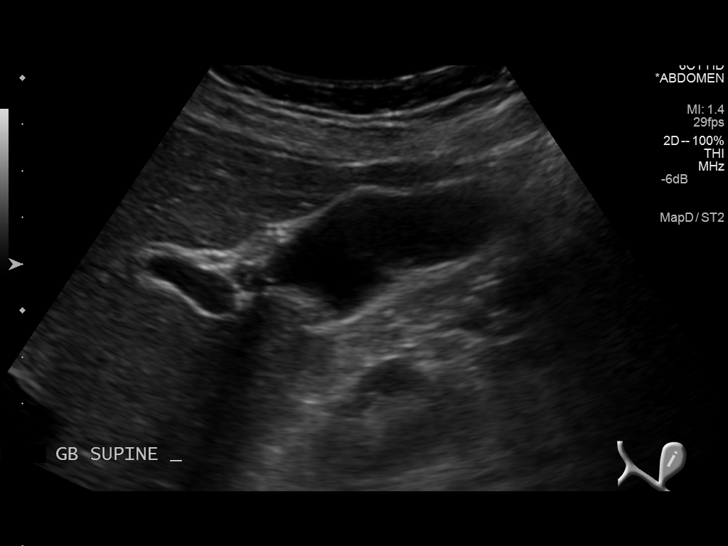
[im 8/93]
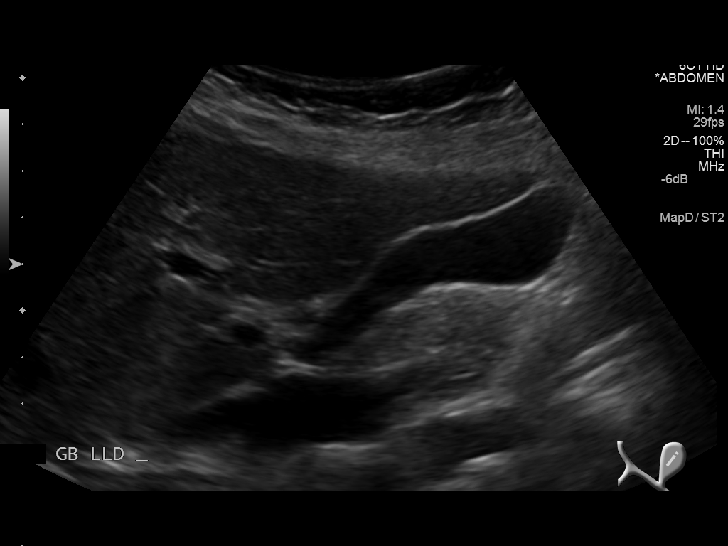
[im 16/93]
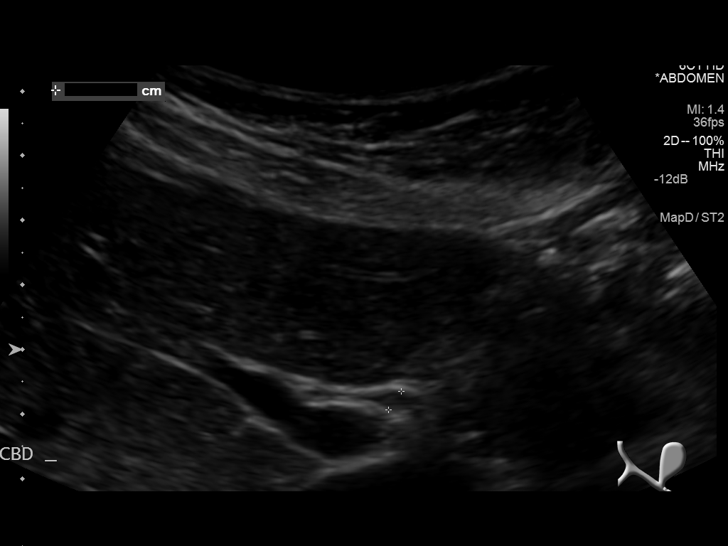
[im 24/93]
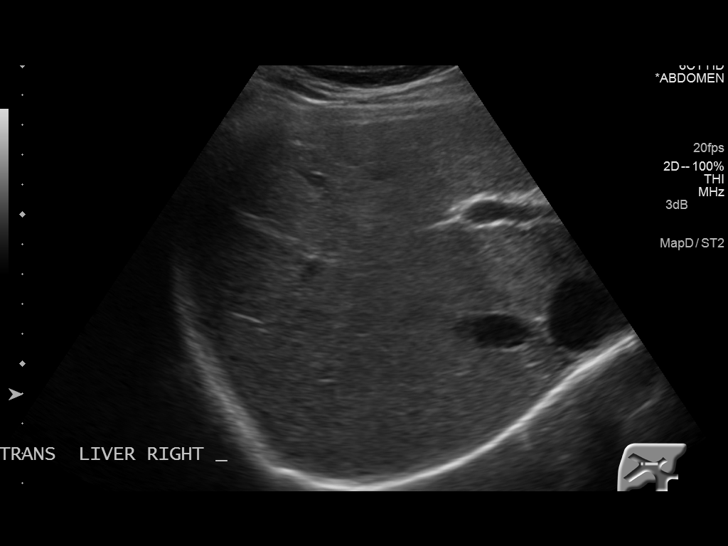
[im 31/93]
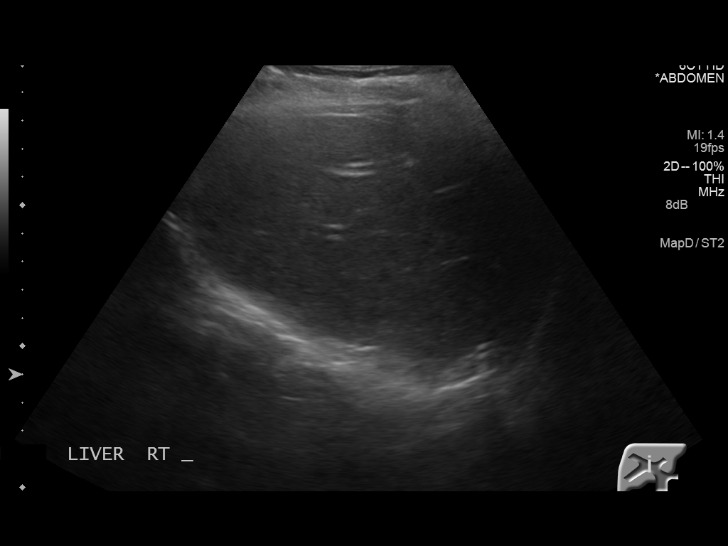
[im 35/93]
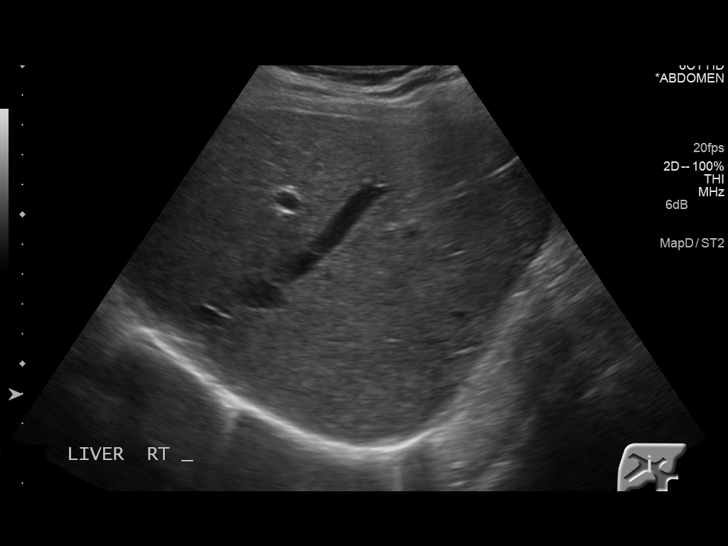
[im 43/93]
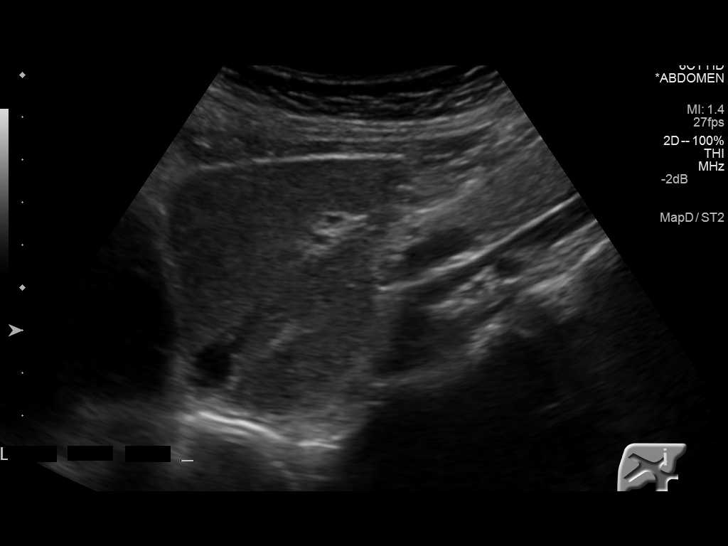
[im 50/93]
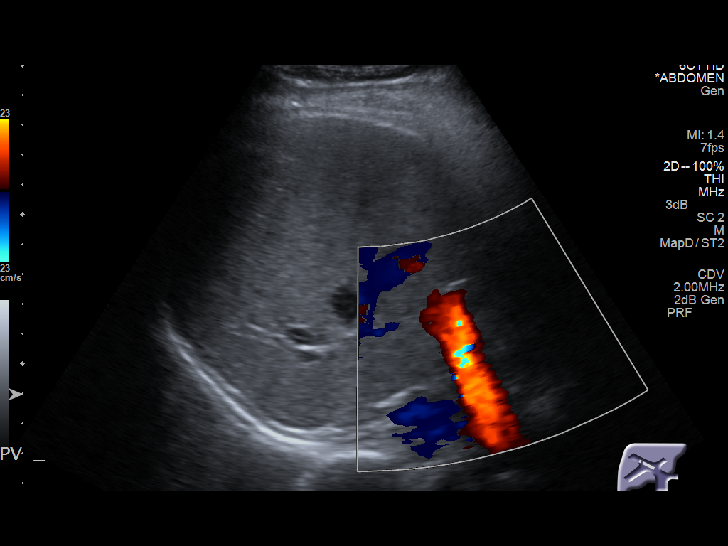
[im 58/93]
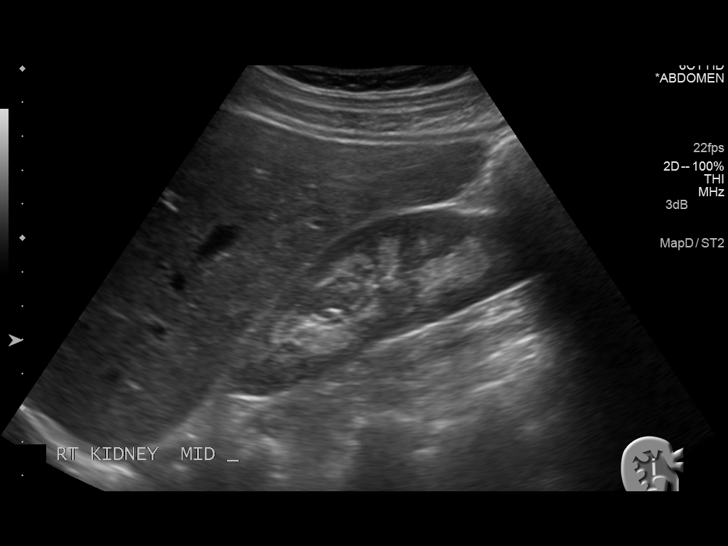
[im 62/93]
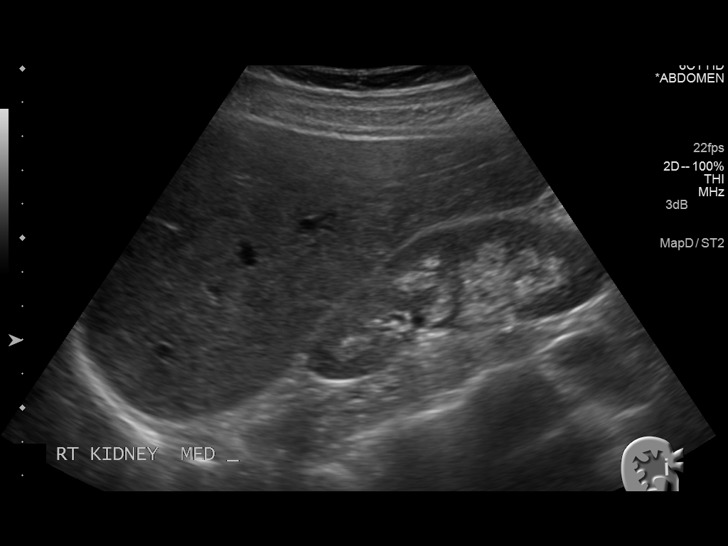
[im 70/93]
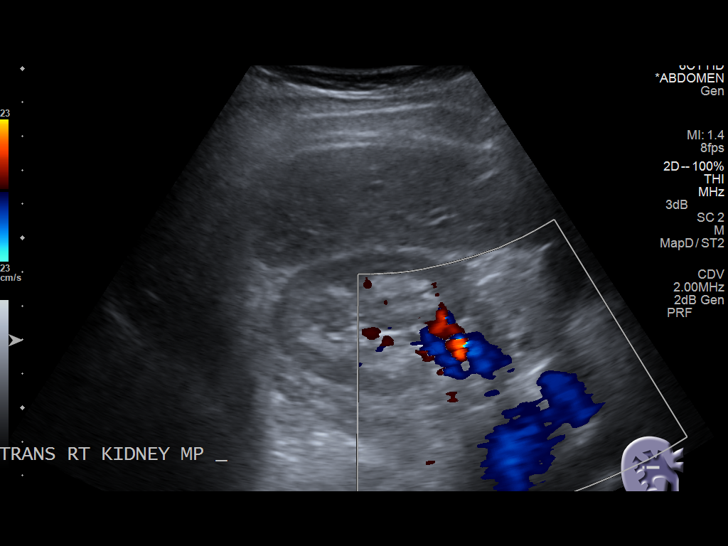
[im 77/93]
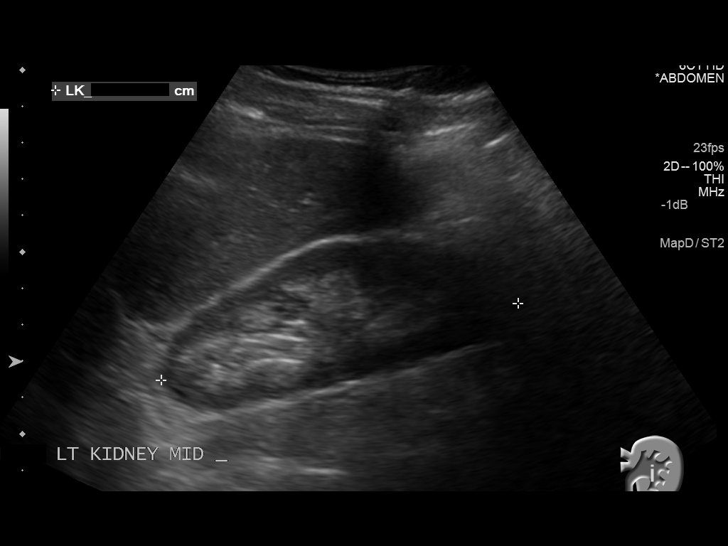
[im 85/93]
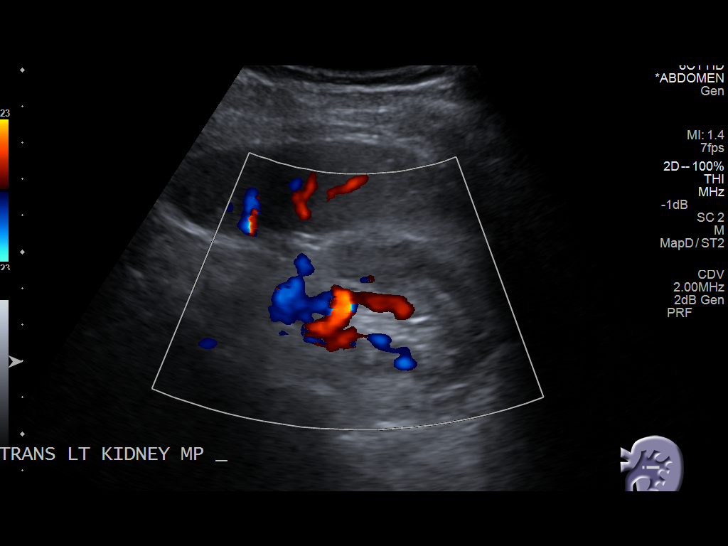
[im 93/93]
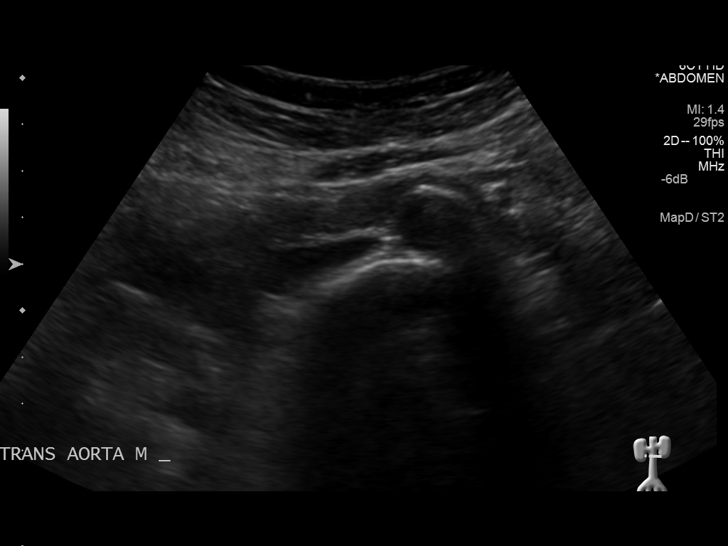

[14 of 25 positions shown; findings below may reference images not displayed]

FINDINGS: Gallbladder: No gallstones or wall thickening visualized. No
sonographic Murphy sign noted.

Common bile duct: Diameter: 3 mm

Liver: No focal lesion identified. Within normal limits in
parenchymal echogenicity. Antegrade flow in the imaged hepatic and
portal venous system.

IVC: No abnormality visualized.

Pancreas: The midline portions are visualized and negative.

Spleen: Size and appearance within normal limits.

Right Kidney: Length: 10 cm. Prominent echogenicity of the medullary
pyramids without shadowing or medullary nephrocalcinosis on 8949 CT.
No hydronephrosis. No hydronephrosis.

Left Kidney: Length: 10 cm. Prominent echogenicity of the medullary
pyramids as above. No hydronephrosis.

Abdominal aorta: No aneurysm visualized.

Other findings: None.
IMPRESSION: Negative abdominal ultrasound.

## 2016-07-25 ENCOUNTER — Ambulatory Visit (INDEPENDENT_AMBULATORY_CARE_PROVIDER_SITE_OTHER): Payer: BLUE CROSS/BLUE SHIELD | Admitting: Family Medicine

## 2016-07-25 ENCOUNTER — Encounter: Payer: Self-pay | Admitting: Family Medicine

## 2016-07-25 VITALS — BP 116/70 | HR 68 | Temp 98.0°F | Resp 16 | Wt 118.8 lb

## 2016-07-25 DIAGNOSIS — Z13220 Encounter for screening for lipoid disorders: Secondary | ICD-10-CM | POA: Diagnosis not present

## 2016-07-25 DIAGNOSIS — M62838 Other muscle spasm: Secondary | ICD-10-CM

## 2016-07-25 DIAGNOSIS — H1132 Conjunctival hemorrhage, left eye: Secondary | ICD-10-CM | POA: Diagnosis not present

## 2016-07-25 MED ORDER — CYCLOBENZAPRINE HCL 5 MG PO TABS: ORAL_TABLET | ORAL | 1 refills | Status: DC
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
## 2016-07-25 NOTE — Patient Instructions (Addendum)
Schedule Tylenol extra strength 500 mg. Up to 3000 mg/day. Stop tramadol. Use warm compresses to eye several x day. Congratulations on stopping smoking!! We will call you with the lab result when available.

## 2016-09-19 DIAGNOSIS — G540 Brachial plexus disorders: Secondary | ICD-10-CM | POA: Diagnosis not present

## 2016-09-19 DIAGNOSIS — Z7901 Long term (current) use of anticoagulants: Secondary | ICD-10-CM | POA: Diagnosis not present

## 2016-09-19 DIAGNOSIS — Z86718 Personal history of other venous thrombosis and embolism: Secondary | ICD-10-CM | POA: Diagnosis not present

## 2016-10-04 ENCOUNTER — Other Ambulatory Visit: Payer: Self-pay | Admitting: Family Medicine

## 2016-10-04 DIAGNOSIS — M62838 Other muscle spasm: Secondary | ICD-10-CM

## 2016-10-07 ENCOUNTER — Other Ambulatory Visit: Payer: Self-pay | Admitting: Family Medicine

## 2016-10-07 DIAGNOSIS — F419 Anxiety disorder, unspecified: Secondary | ICD-10-CM

## 2016-10-08 ENCOUNTER — Other Ambulatory Visit: Payer: Self-pay | Admitting: Family Medicine

## 2016-10-08 NOTE — Telephone Encounter (Signed)
Prescription has been called into pharmacy. KW 

## 2016-10-08 NOTE — Telephone Encounter (Signed)
Done  ED 

## 2016-12-09 ENCOUNTER — Other Ambulatory Visit: Payer: Self-pay | Admitting: Family Medicine

## 2016-12-09 DIAGNOSIS — M62838 Other muscle spasm: Secondary | ICD-10-CM

## 2016-12-28 ENCOUNTER — Other Ambulatory Visit: Payer: Self-pay | Admitting: Family Medicine

## 2016-12-28 DIAGNOSIS — F439 Reaction to severe stress, unspecified: Secondary | ICD-10-CM

## 2017-01-28 IMAGING — US US EXTREM  UP VENOUS*L*
1 series · 13 of 24 positions shown · non-contrast
Comparison: None.

CLINICAL DATA: Pain swelling involving the left upper extremity
past week. History of smoking. Evaluate for DVT.



[Series 1: us extrem up venous*left* · 0.07mm/px · 13 of 47 slices shown]
[im 1/47]
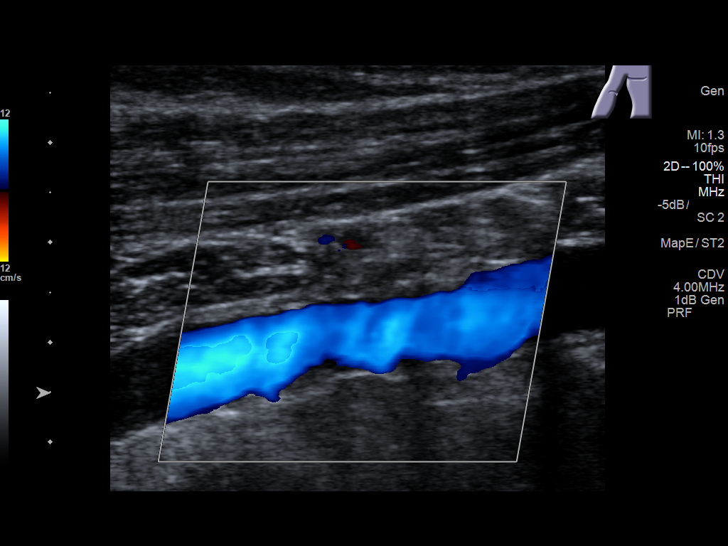
[im 5/47]
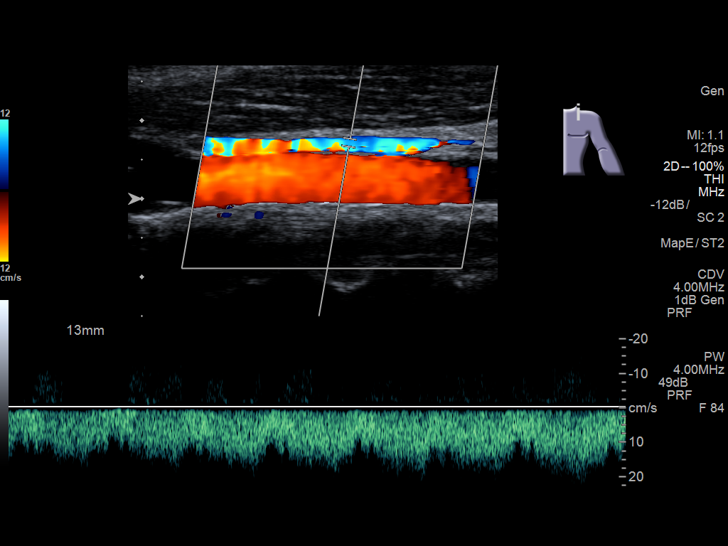
[im 9/47]
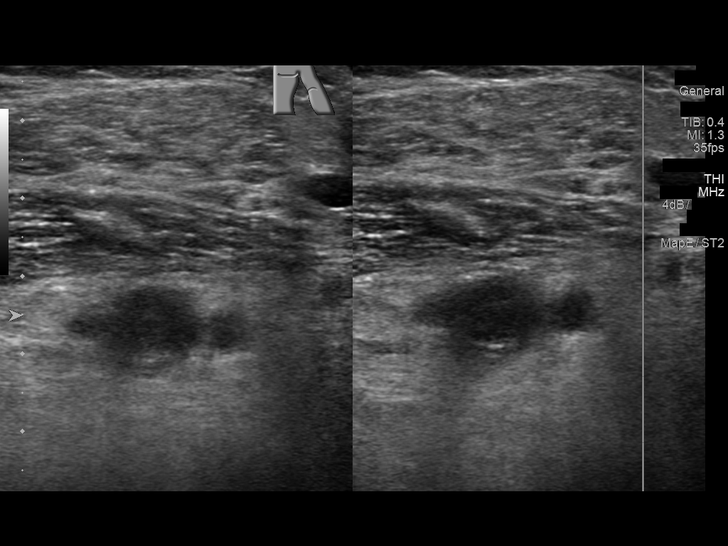
[im 13/47]
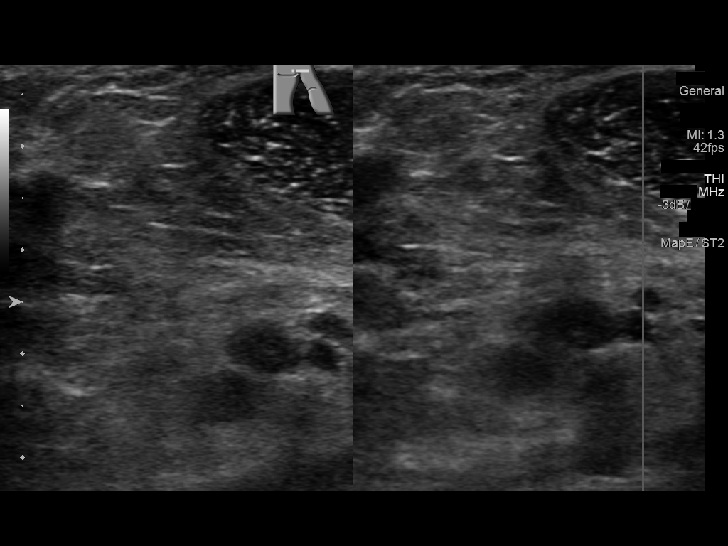
[im 17/47]
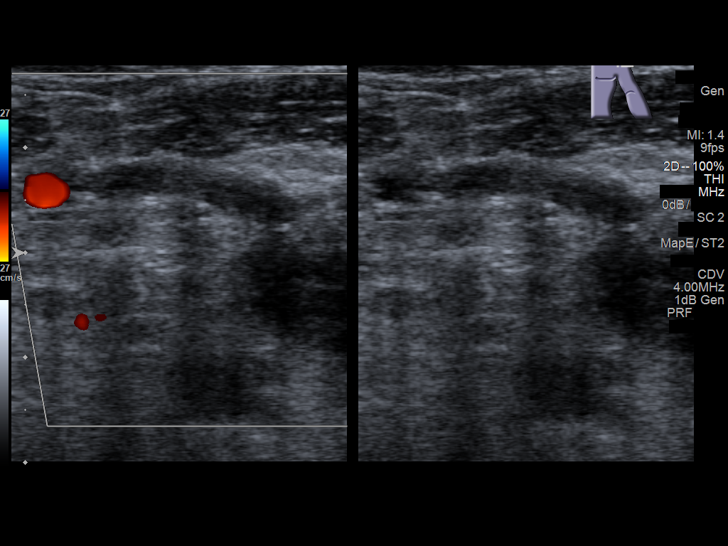
[im 21/47]
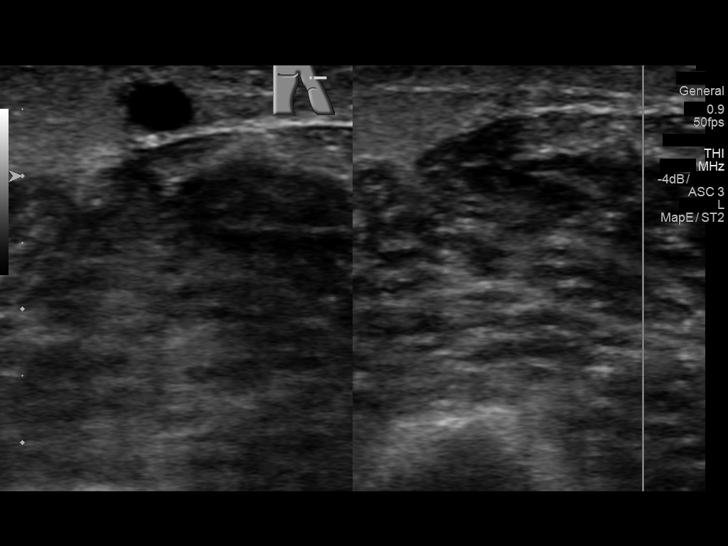
[im 25/47]
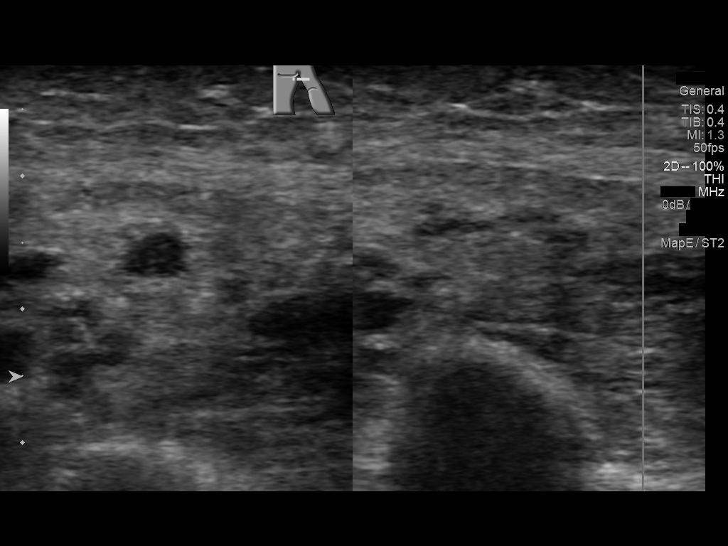
[im 27/47]
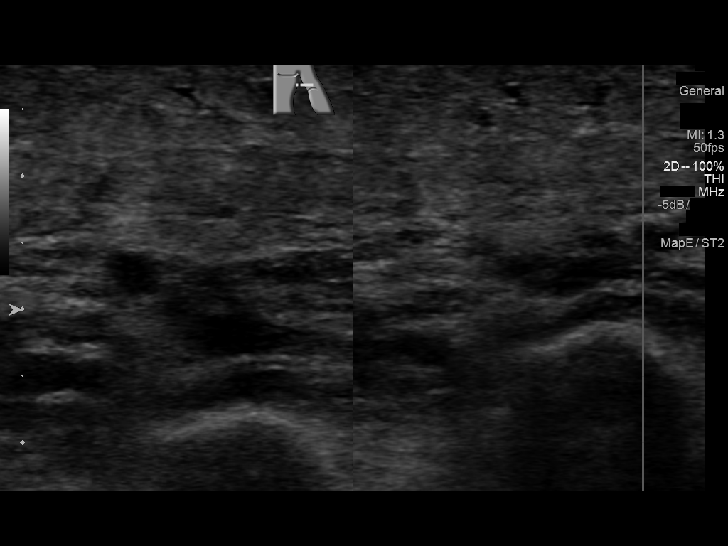
[im 31/47]
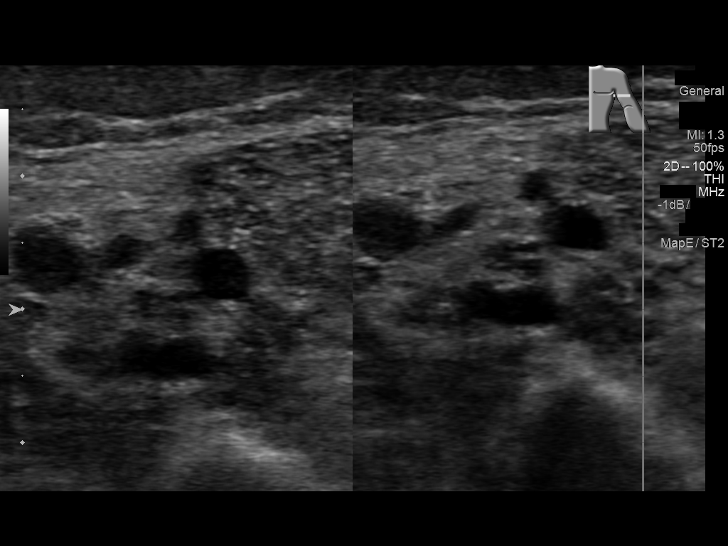
[im 35/47]
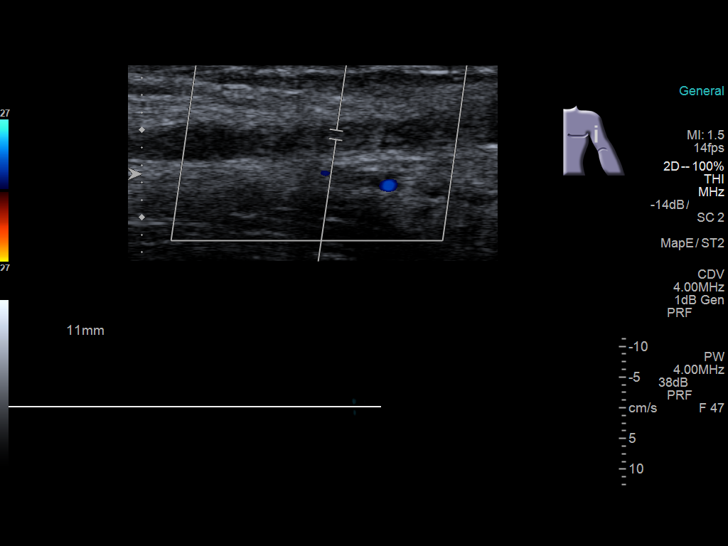
[im 39/47]
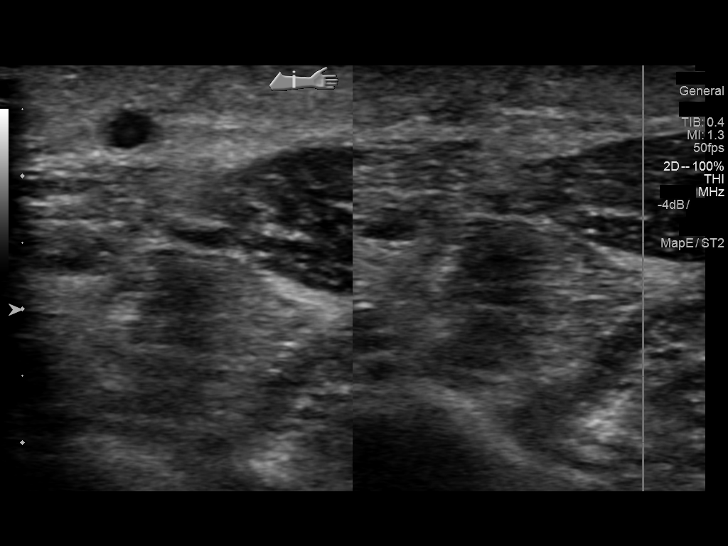
[im 43/47]
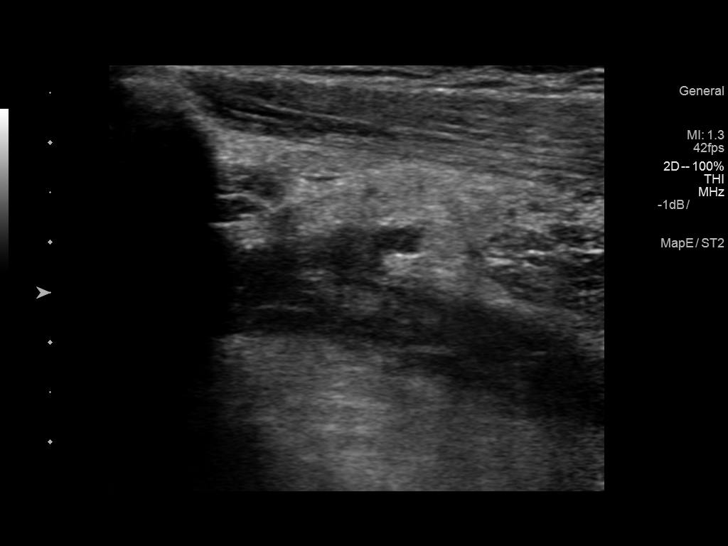
[im 47/47]
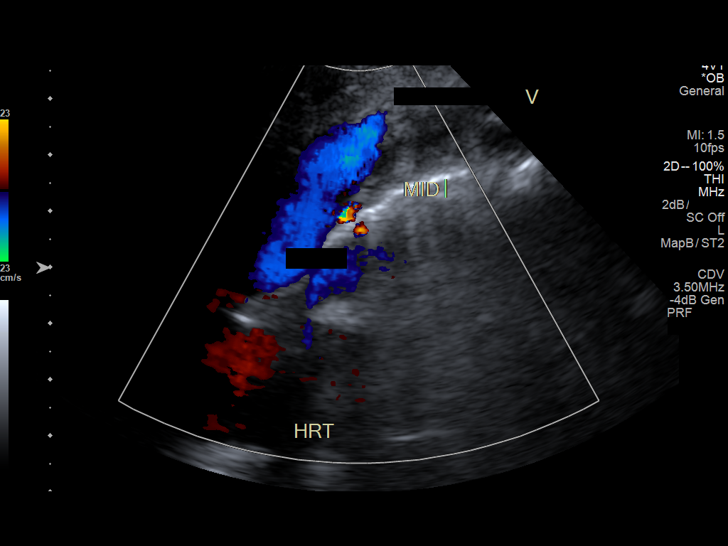

[13 of 24 positions shown; findings below may reference images not displayed]

FINDINGS: Contralateral Subclavian Vein: Respiratory phasicity is normal and
symmetric with the symptomatic side. No evidence of thrombus. Normal
compressibility.

Internal Jugular Vein: No evidence of thrombus. Normal
compressibility, respiratory phasicity and response to augmentation.

There is mixed echogenic DVT within the left subclavian vein. The
DVT appears nonocclusive centrally (image 9), though occlusive about
its distal aspect (image 13).

There is occlusive DVT within the imaged course of the left axillary
vein (representative images 15 and 16), extending to involve each of
the paired brachial veins (representative images 37 and 40)

The left radial ulnar veins appear patent where imaged.

There is occlusive SVT within the proximal aspect of the left
cephalic vein (representative images 19 and 20). The mid and distal
aspects of the cephalic vein appear patent.

The basilic vein appears patent were imaged.

Other Findings:  None visualized.
IMPRESSION: 1. Examination is positive for occlusive DVT extending from the
peripheral aspect of the left subclavian vein through both paired
brachial veins.
2. Examination is positive for occlusive SVT involving the central
aspect of the left cephalic vein.
This was made a call report.

## 2017-02-04 ENCOUNTER — Other Ambulatory Visit: Payer: Self-pay | Admitting: Family Medicine

## 2017-02-04 DIAGNOSIS — F419 Anxiety disorder, unspecified: Secondary | ICD-10-CM

## 2017-02-05 ENCOUNTER — Other Ambulatory Visit: Payer: Self-pay | Admitting: Family Medicine

## 2017-02-05 DIAGNOSIS — F419 Anxiety disorder, unspecified: Secondary | ICD-10-CM

## 2017-02-05 MED ORDER — CLONAZEPAM 0.5 MG PO TABS: ORAL_TABLET | ORAL | 2 refills | Status: DC

## 2017-02-05 NOTE — Progress Notes (Signed)
[facility]  

## 2017-03-06 DIAGNOSIS — I82A22 Chronic embolism and thrombosis of left axillary vein: Secondary | ICD-10-CM | POA: Diagnosis not present

## 2017-03-06 DIAGNOSIS — G540 Brachial plexus disorders: Secondary | ICD-10-CM | POA: Diagnosis not present

## 2017-03-06 DIAGNOSIS — Z6821 Body mass index (BMI) 21.0-21.9, adult: Secondary | ICD-10-CM | POA: Diagnosis not present

## 2017-03-09 ENCOUNTER — Other Ambulatory Visit: Payer: Self-pay | Admitting: Family Medicine

## 2017-05-26 ENCOUNTER — Telehealth: Payer: Self-pay

## 2017-05-26 NOTE — Telephone Encounter (Signed)
Patient called wanting to get a nurse to call her back. She reports that she was on blood thinners for over 1 year due to a DVT. She reports that her cardiologist took her off of the blood thinners about 2 weeks ago. She states that she has pain in her right breast that radiates to her back, but cant tell if this is a blood clot forming or a pulled muscle. She has also been lifting heavy boxes at work as well. She denies any shortness of breath, headaches, or swelling. Her call back number is [phone].

## 2017-05-26 NOTE — Telephone Encounter (Signed)
Spoke with patient on the phone she states that she has a sharp pain behind her right breast/rib area for about two weeks. Patient states that she had a blood clot in her left arm last year and had concerns that this is a possible clot. Patient reports that she has been doping heavy lifting over the past week but dosen't feel that it is coming from exertion. Patient states that doctor  Dr.Pascarella ( vascular surgeon) advised her to discontinue blood thinner on 05/14/17 since it had been a year. Patient denies pain when taking a deep breath or symptom of shortness of breath, patient states that her next availability to come into office wouldn't be till Thursday. Please advise. KW

## 2017-05-26 NOTE — Telephone Encounter (Signed)
Discussed possible chest wall strain and rx with nsaid's and warm compresses. Will f/u in the office later this week if not improving.

## 2017-06-12 DIAGNOSIS — I871 Compression of vein: Secondary | ICD-10-CM | POA: Diagnosis not present

## 2017-06-12 DIAGNOSIS — S40022A Contusion of left upper arm, initial encounter: Secondary | ICD-10-CM | POA: Diagnosis not present

## 2017-06-12 DIAGNOSIS — W228XXA Striking against or struck by other objects, initial encounter: Secondary | ICD-10-CM | POA: Diagnosis not present

## 2017-06-12 DIAGNOSIS — I82A22 Chronic embolism and thrombosis of left axillary vein: Secondary | ICD-10-CM | POA: Diagnosis not present

## 2017-06-12 DIAGNOSIS — G540 Brachial plexus disorders: Secondary | ICD-10-CM | POA: Diagnosis not present

## 2017-06-16 ENCOUNTER — Telehealth: Payer: Self-pay | Admitting: Family Medicine

## 2017-06-16 NOTE — Telephone Encounter (Signed)
Last filled 06/19/16, please review. KW

## 2017-06-16 NOTE — Telephone Encounter (Signed)
[facility]-days supply for the following medication. Thanks CC ° °hydrochlorothiazide (HYDRODIURIL) 25 MG tablet  ° °

## 2017-06-17 ENCOUNTER — Other Ambulatory Visit: Payer: Self-pay | Admitting: Family Medicine

## 2017-06-17 MED ORDER — HYDROCHLOROTHIAZIDE 25 MG PO TABS: 25.0000 mg | ORAL_TABLET | Freq: Every day | ORAL | 0 refills | Status: DC

## 2017-06-20 ENCOUNTER — Other Ambulatory Visit: Payer: Self-pay | Admitting: Family Medicine

## 2017-06-20 NOTE — Telephone Encounter (Signed)
Please call her and ask her how much longer her vascular surgeon wishes her to remain on this medication.

## 2017-06-20 NOTE — Telephone Encounter (Signed)
LMTCB

## 2017-06-20 NOTE — Telephone Encounter (Signed)
Pt is returning call.  CB#[phone]/MW

## 2017-06-24 NOTE — Telephone Encounter (Signed)
Patient states that she has been off Eliquis since October, she saw her vascular surgeon on 06/10/17 and performed a ultrasound, based of the results of the ultrasound it was determined that patient does not need to be back on medication. Please disregard refill . KW

## 2017-06-29 ENCOUNTER — Other Ambulatory Visit: Payer: Self-pay | Admitting: Family Medicine

## 2017-06-29 DIAGNOSIS — M62838 Other muscle spasm: Secondary | ICD-10-CM

## 2017-08-27 IMAGING — CR DG CHEST 2V
1 series · 2 of 2 positions shown · non-contrast
Comparison: None.

CLINICAL DATA: Chest pain, left arm swelling, and chest tightness
for 6 days.

EXAM:
CHEST - 2 VIEW

[Series 1: dg chest 2 view · 0.14mm/px · 2 of 2 slices shown]
[im 1/2]
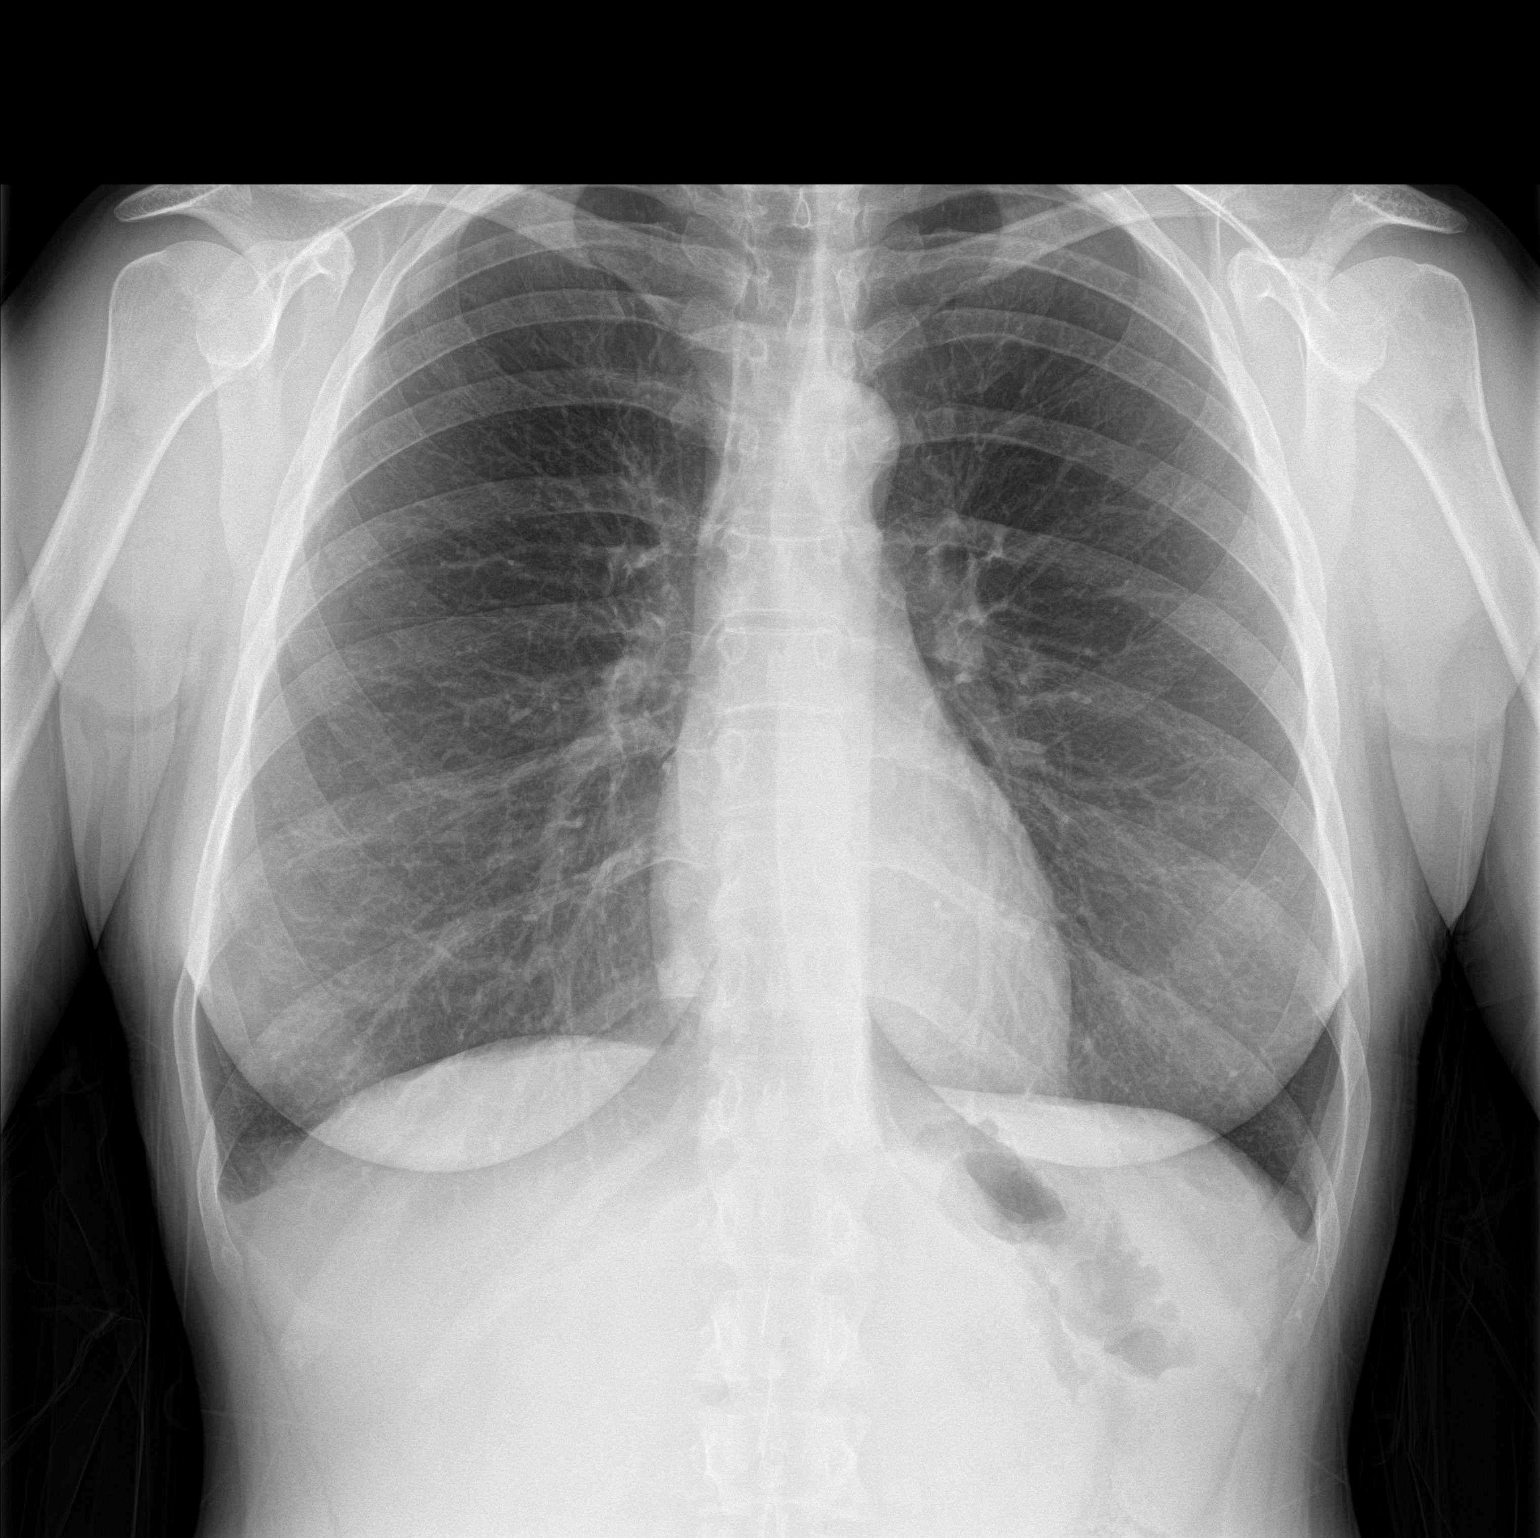
[im 2/2]
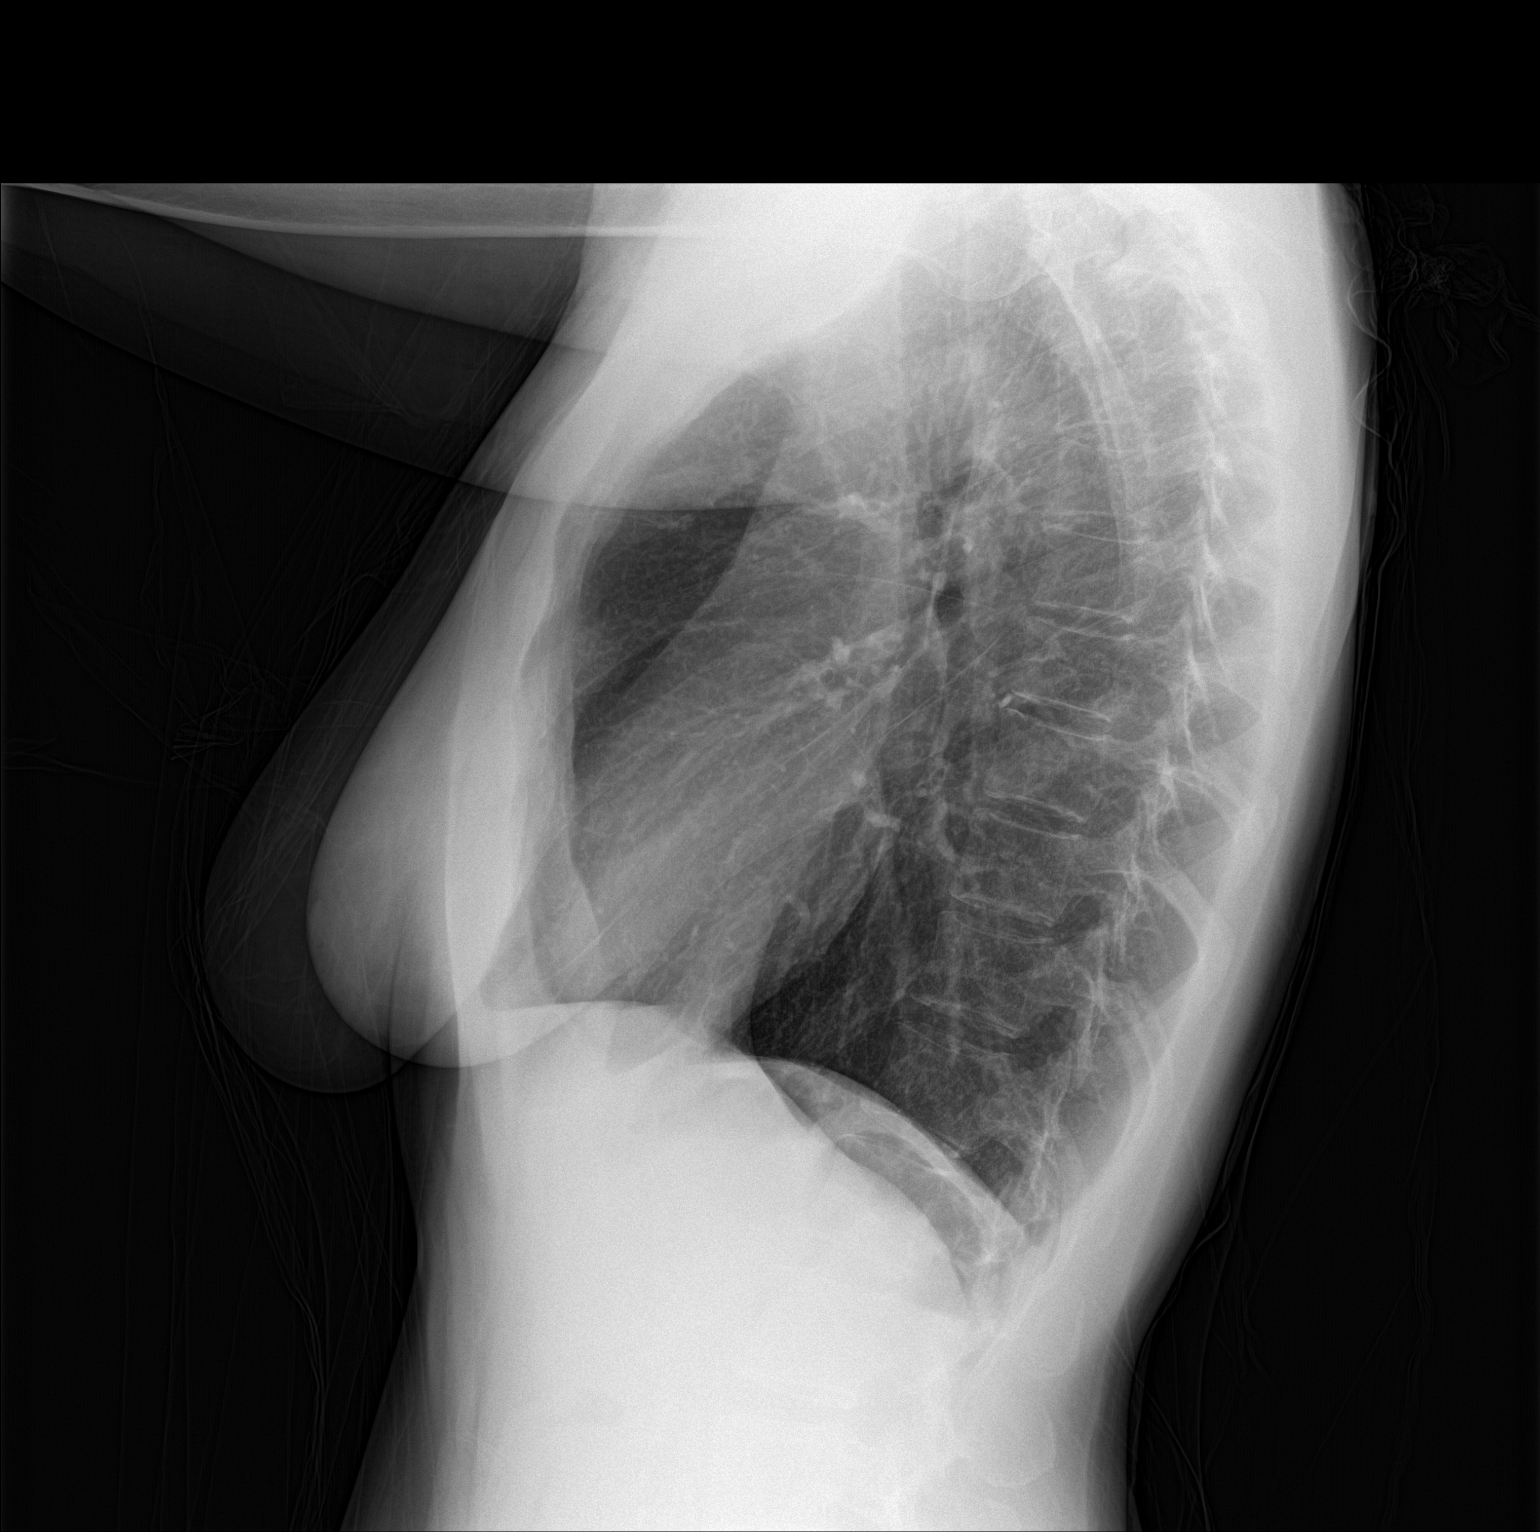

[2 of 2 positions shown; findings below may reference images not displayed]

FINDINGS: The heart size and mediastinal contours are within normal limits.
Both lungs are clear. The visualized skeletal structures are
unremarkable.
IMPRESSION: No active disease.

## 2017-09-02 IMAGING — CT CT ANGIO CHEST
2 of 6 series · 19 of 46 positions shown · IV contrast (APPLIED)
Comparison: None.

CLINICAL DATA: Left arm DVT with shortness of breath and chest pain
for 5 days

EXAM:
CT ANGIOGRAPHY CHEST WITH CONTRAST
TECHNIQUE: Multidetector CT imaging of the chest was performed using the
standard protocol during bolus administration of intravenous
contrast. Multiplanar CT image reconstructions and MIPs were
obtained to evaluate the vascular anatomy.
CONTRAST:  75 mL Isovue 370.

[Series 5: pe thins 1.5 · axial · 0.68mm/px · z∈[-691,-379]mm · 16 of 292 slices shown]
[im 16/292  lung]
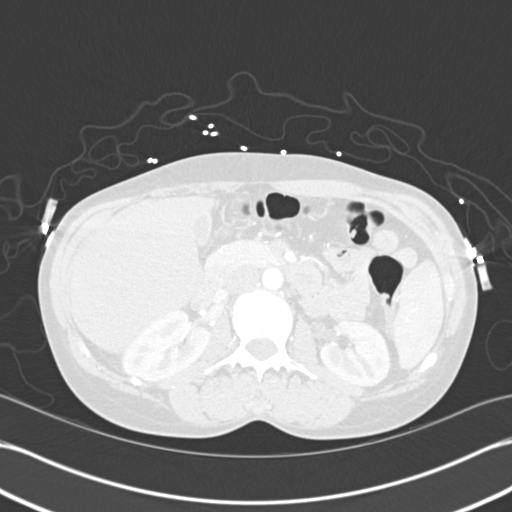
[im 31/292  soft-tissue]
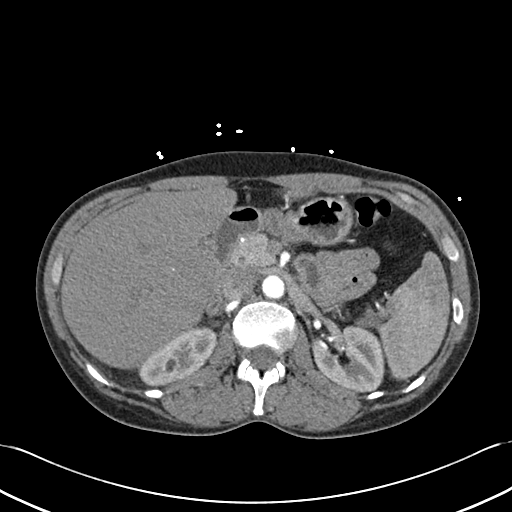
[im 46/292  lung]
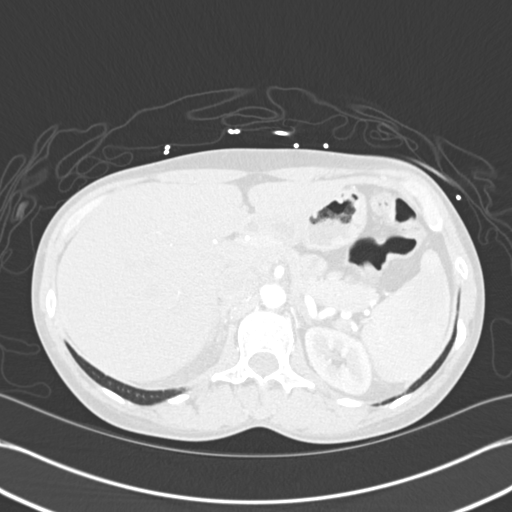
[im 62/292  soft-tissue]
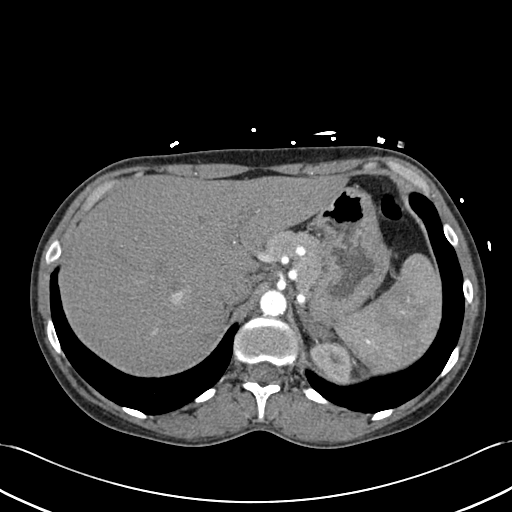
[im 92/292  lung]
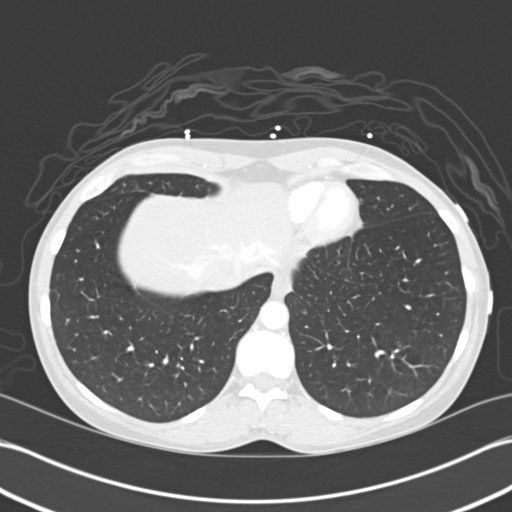
[im 108/292  soft-tissue]
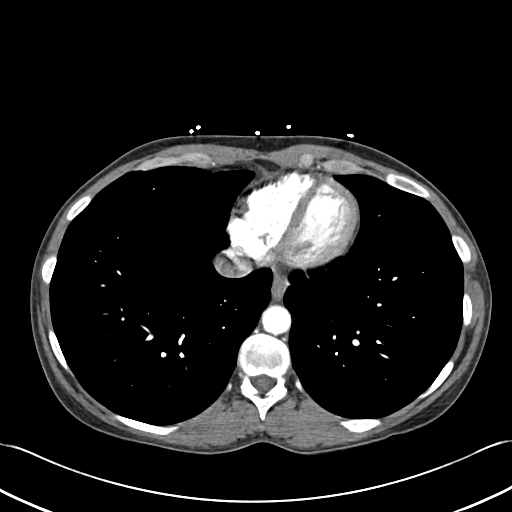
[im 123/292  lung]
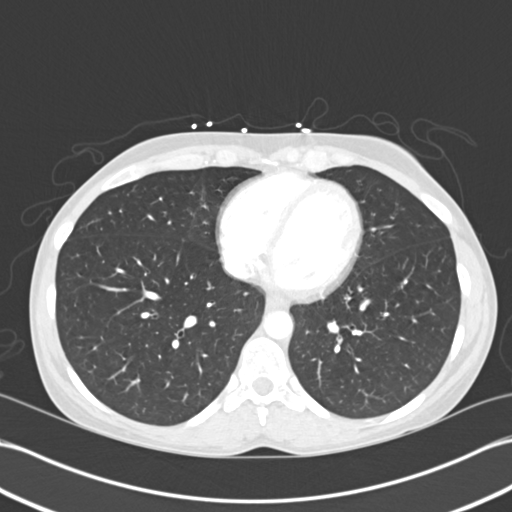
[im 138/292  soft-tissue]
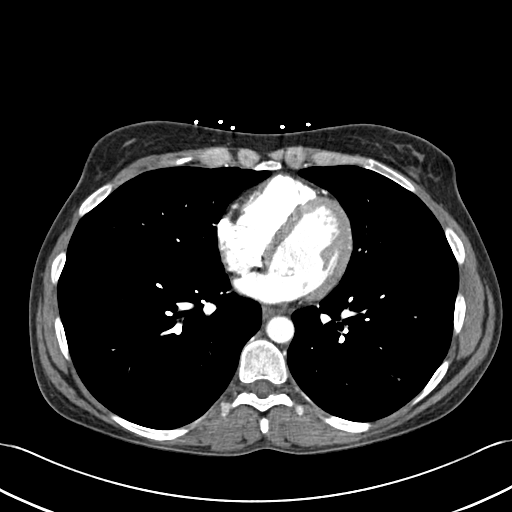
[im 154/292  lung]
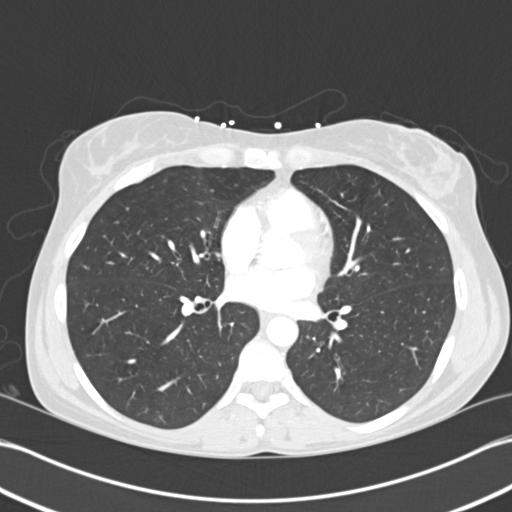
[im 169/292  soft-tissue]
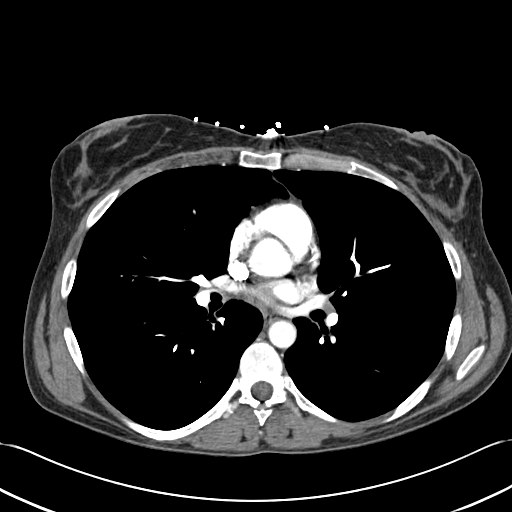
[im 184/292  lung]
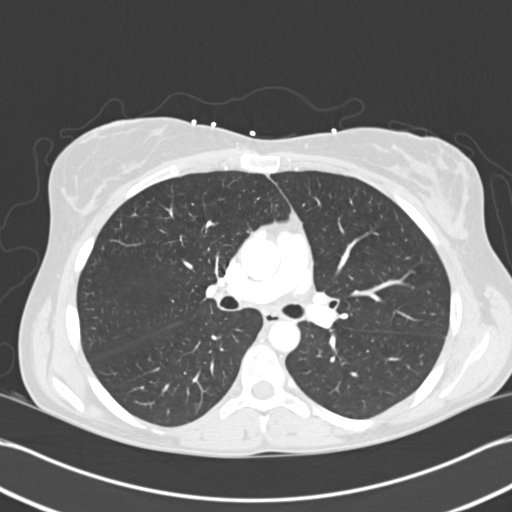
[im 200/292  soft-tissue]
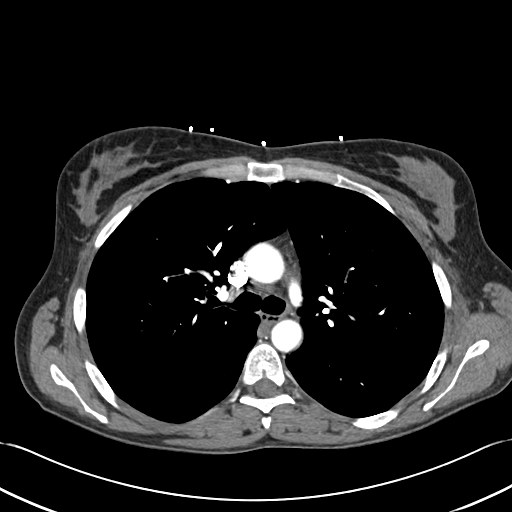
[im 230/292  lung]
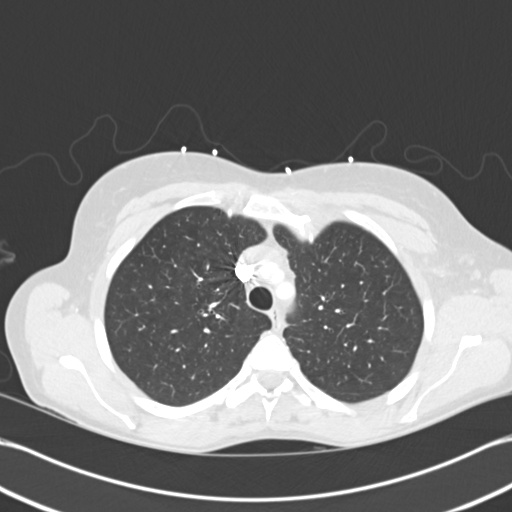
[im 246/292  soft-tissue]
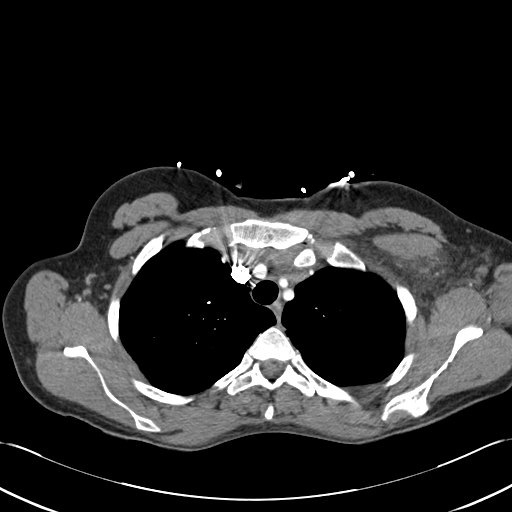
[im 261/292  lung]
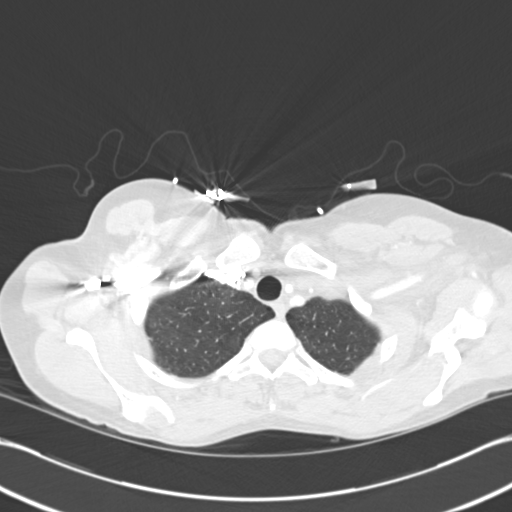
[im 276/292  soft-tissue]
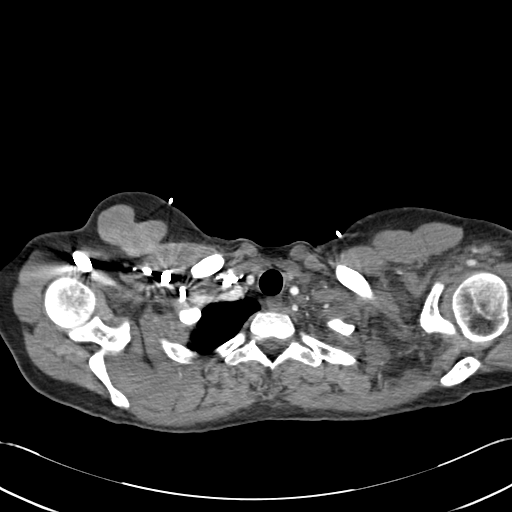

[Series 7: cor mpr 2.0 · coronal · 0.63mm/px · 3 of 113 slices shown]
[im 29/113  soft-tissue]
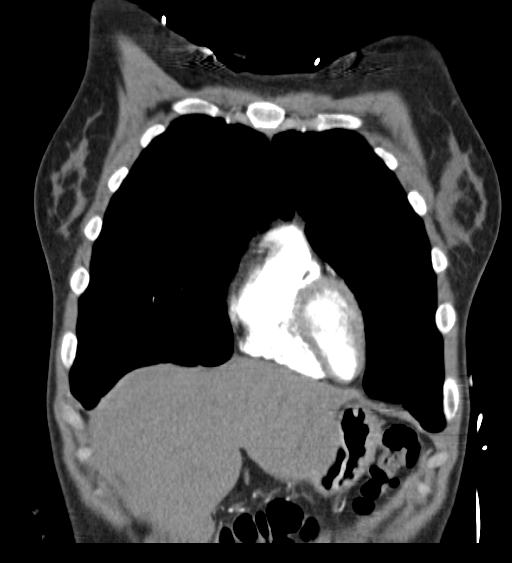
[im 57/113  soft-tissue]
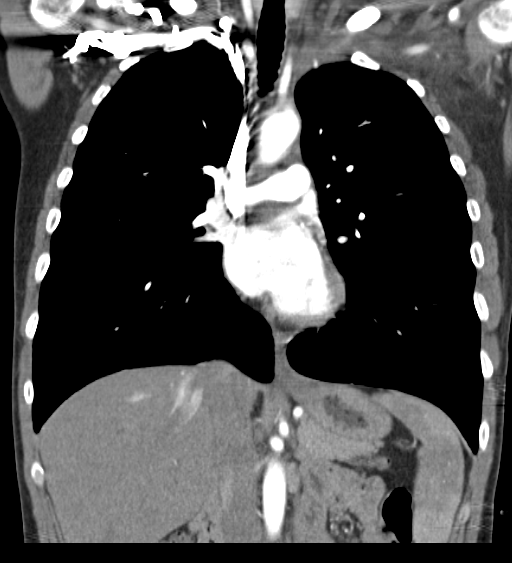
[im 85/113  soft-tissue]
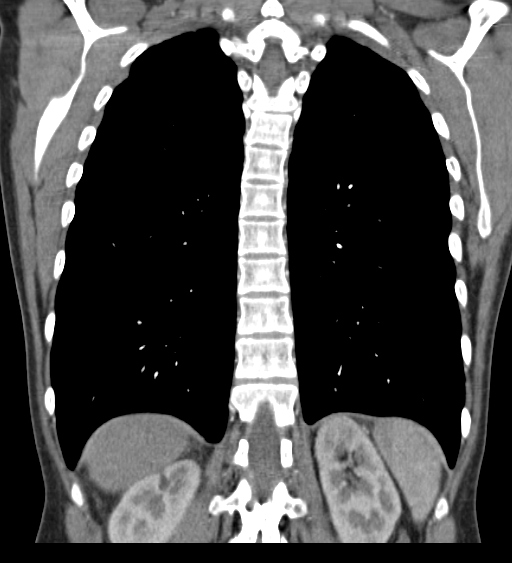

[19 of 46 positions shown; findings below may reference images not displayed]

FINDINGS: Lungs are well aerated bilaterally without focal infiltrate or
sizable effusion.

The thoracic inlet is within normal limits. Increased density is
noted in the left subclavian vein and cephalic and axillary veins
with surrounding inflammatory change consistent with the given
clinical history of deep venous thrombosis.

The thoracic aorta and its branches are within normal limits. The
pulmonary artery demonstrates a normal branching pattern without
filling defect to suggest pulmonary embolism. No right heart strain
is seen. No thrombus is noted within the atrium or right ventricle.
No hilar or mediastinal adenopathy is seen.

Visualized upper abdomen shows the gallbladder to be decompressed.
No other focal abnormality is seen. The bony structures are within
normal limits.

Review of the MIP images confirms the above findings.
IMPRESSION: No evidence of pulmonary embolism.

Changes in the left subclavian vein consistent with the known
history of deep venous thrombosis.

No other focal abnormality

## 2017-09-03 IMAGING — XA IR CHEST FLUORO
8 series · 15 of 24 positions shown · IV contrast (IODINE)
Comparison: none

[Series 1: forearm · 4 acquisitions, 2 frames shown (1 of 8)]
[im 1/4]
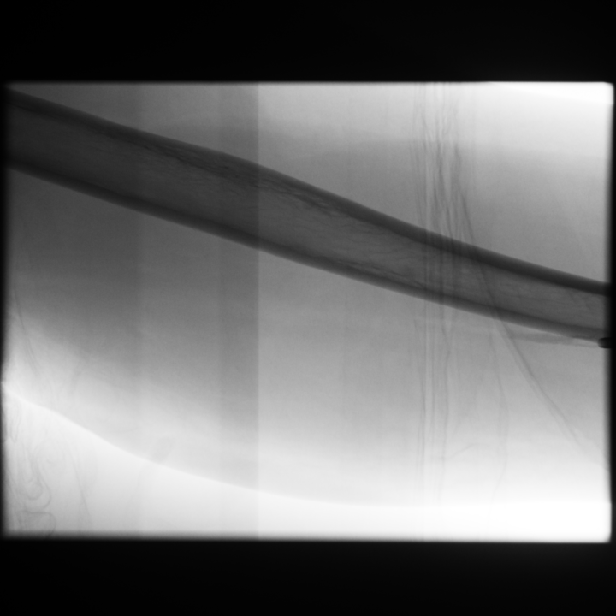
[im 4/4]
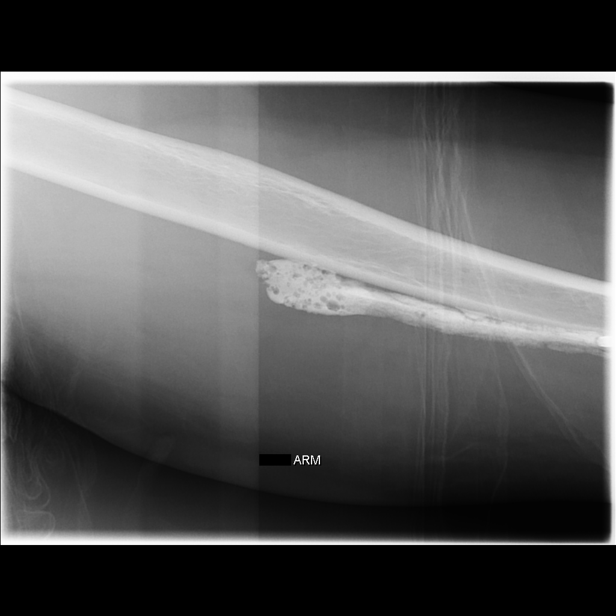

[Series 2: forearm · 5 acquisitions, 2 frames shown (2 of 8)]
[im 3/5]
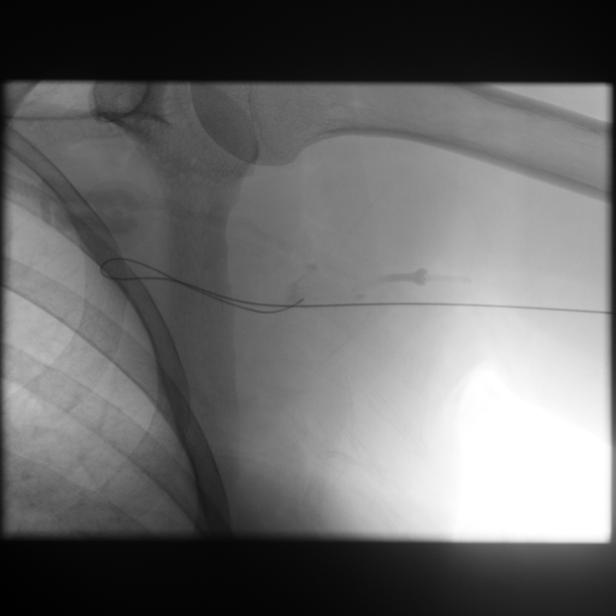
[im 5/5]
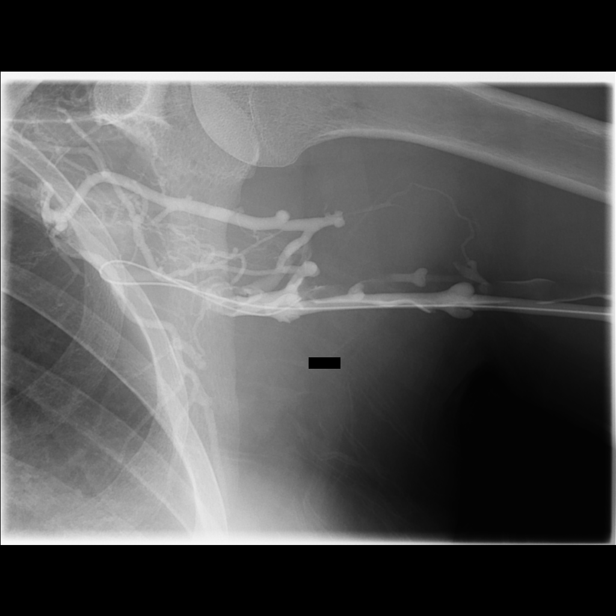

[Series 3: forearm · 4 acquisitions, 2 frames shown (3 of 8)]
[im 2/4]
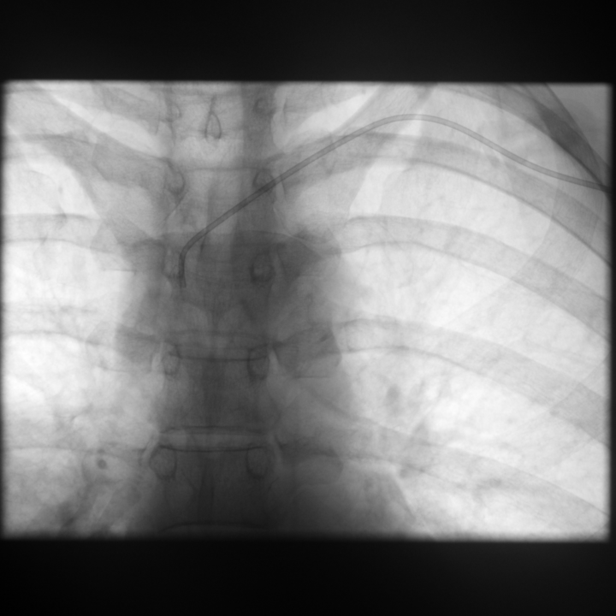
[im 4/4]
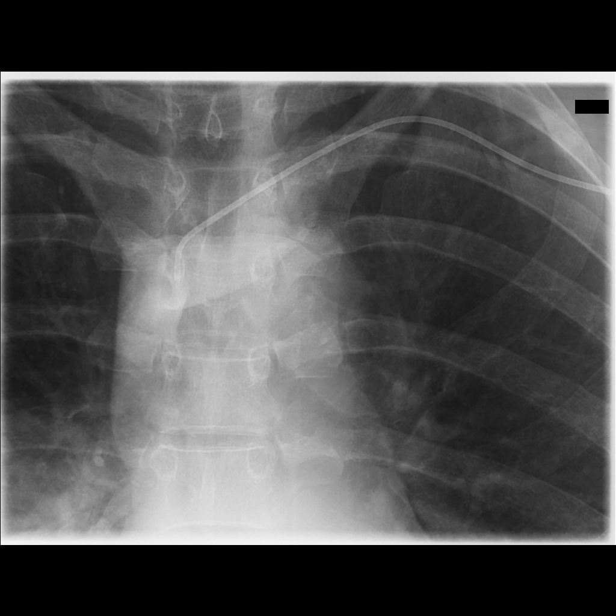

[Series 4: forearm · 4 acquisitions, 1 frame shown (4 of 8)]
[im 1/4]
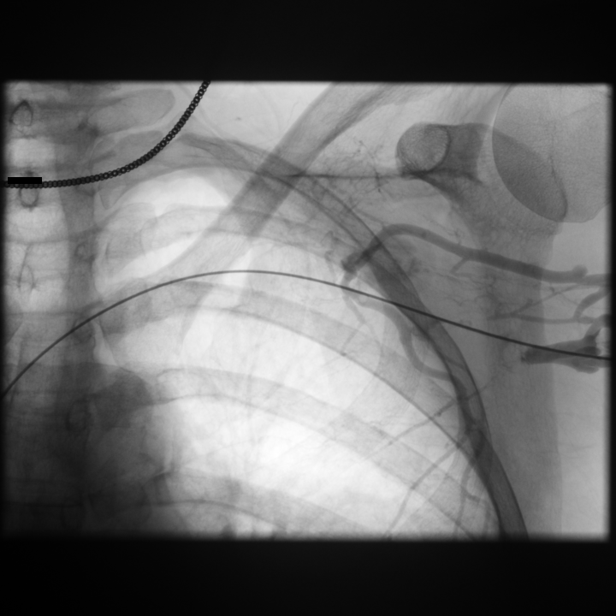

[Series 5: forearm · 4 acquisitions, 2 frames shown (5 of 8)]
[im 1/4]
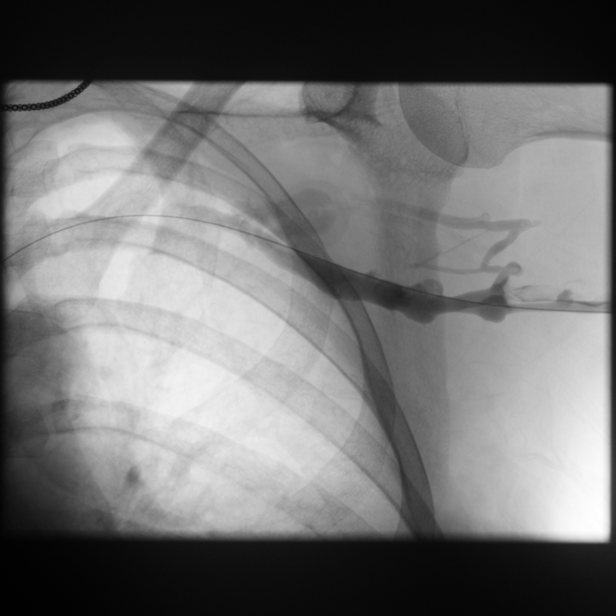
[im 1/4]
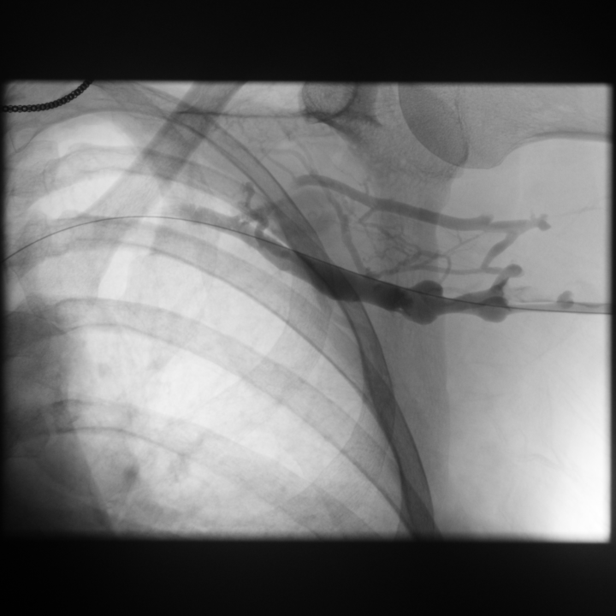

[Series 6: forearm · 4 acquisitions, 2 frames shown (6 of 8)]
[im 1/4]
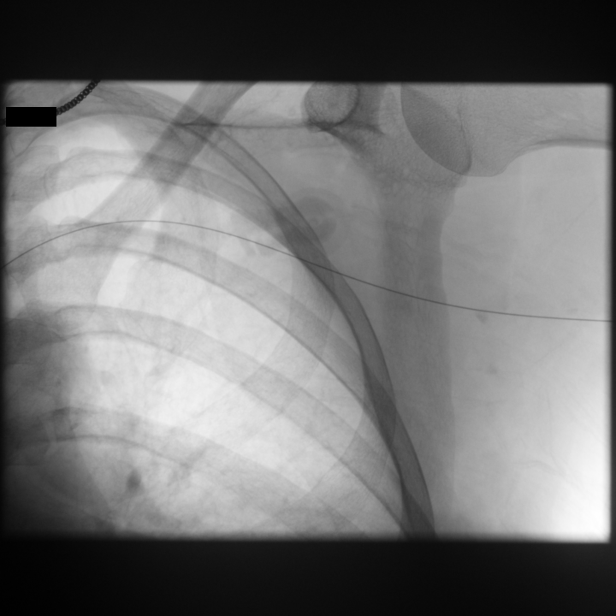
[im 1/4]
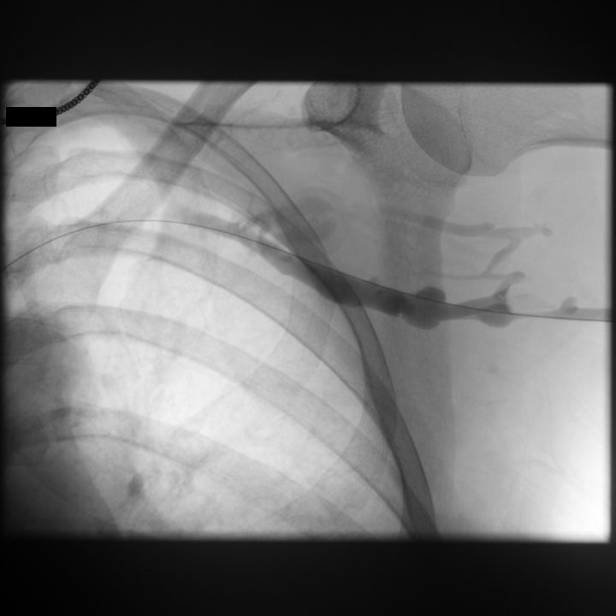

[Series 8: forearm · 4 acquisitions, 2 frames shown (7 of 8)]
[im 1/4]
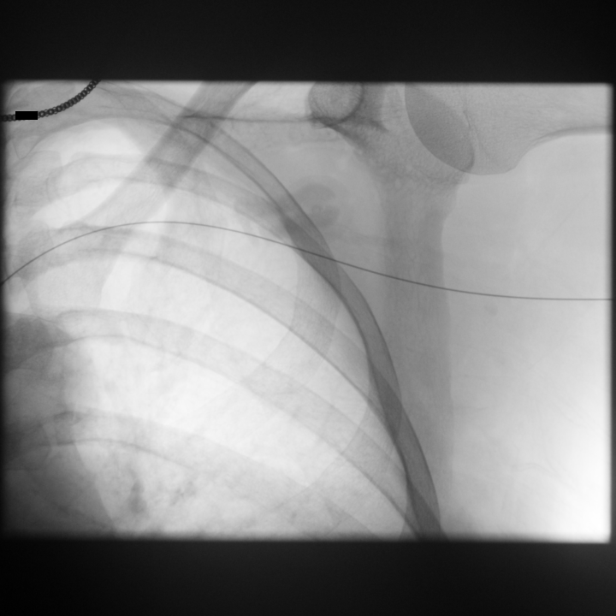
[im 2/4]
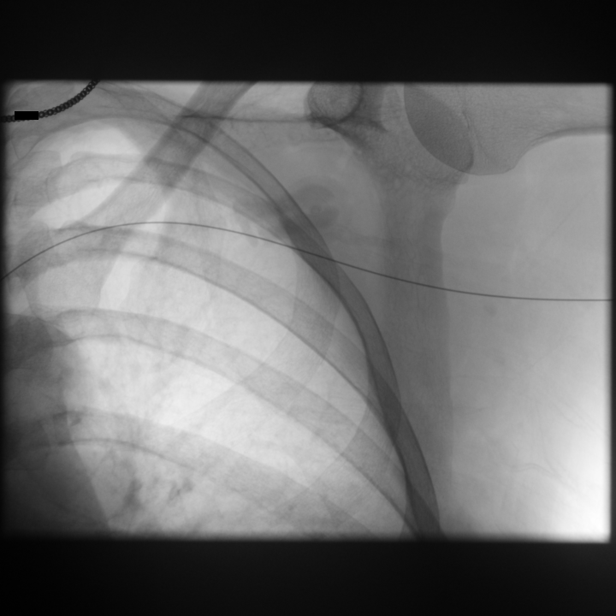

[Series 9: forearm · 4 acquisitions, 2 frames shown (8 of 8)]
[im 1/4]
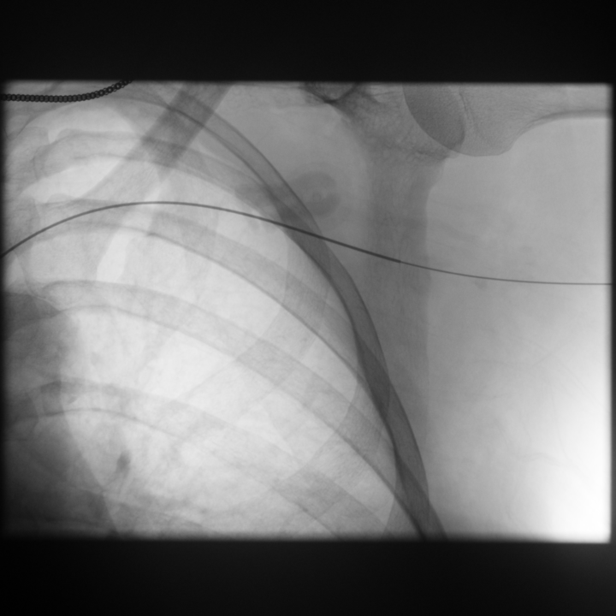
[im 4/4]
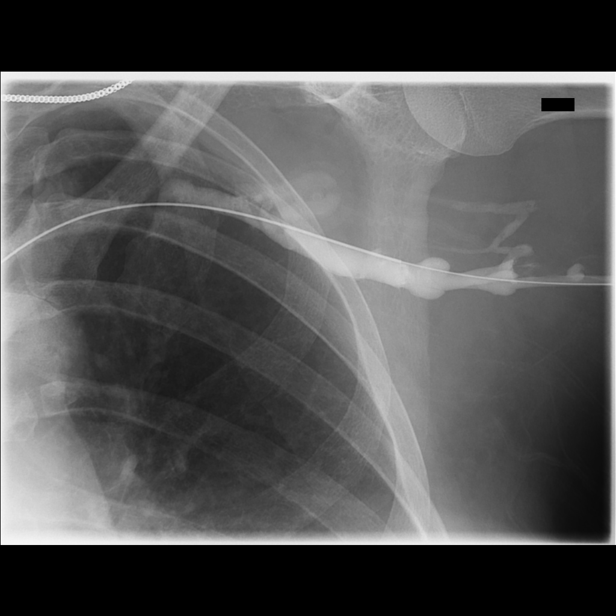

[15 of 24 positions shown; findings below may reference images not displayed]

Canned report from images found in remote index.

Refer to host system for actual result text.

## 2017-09-08 ENCOUNTER — Ambulatory Visit: Payer: Self-pay | Admitting: Family Medicine

## 2017-09-11 ENCOUNTER — Telehealth: Payer: Self-pay | Admitting: Family Medicine

## 2017-09-11 ENCOUNTER — Other Ambulatory Visit: Payer: Self-pay | Admitting: Family Medicine

## 2017-09-11 MED ORDER — HYDROCHLOROTHIAZIDE 25 MG PO TABS: 25.0000 mg | ORAL_TABLET | Freq: Every day | ORAL | 0 refills | Status: DC

## 2017-09-11 NOTE — Telephone Encounter (Signed)
[facility]-days supply for the following medication. Thanks CC ° °hydrochlorothiazide (HYDRODIURIL) 25 MG tablet  ° °

## 2017-09-11 NOTE — Telephone Encounter (Signed)
Please review. Thanks!  

## 2017-09-11 NOTE — Telephone Encounter (Signed)
done

## 2017-09-19 ENCOUNTER — Ambulatory Visit (INDEPENDENT_AMBULATORY_CARE_PROVIDER_SITE_OTHER): Payer: Self-pay | Admitting: Family Medicine

## 2017-09-19 ENCOUNTER — Encounter: Payer: Self-pay | Admitting: Family Medicine

## 2017-09-19 VITALS — BP 120/76 | HR 70 | Temp 98.3°F | Resp 16 | Wt 120.0 lb

## 2017-09-19 DIAGNOSIS — F439 Reaction to severe stress, unspecified: Secondary | ICD-10-CM

## 2017-09-19 DIAGNOSIS — R42 Dizziness and giddiness: Secondary | ICD-10-CM

## 2017-09-19 DIAGNOSIS — I10 Essential (primary) hypertension: Secondary | ICD-10-CM

## 2017-09-19 DIAGNOSIS — Z8669 Personal history of other diseases of the nervous system and sense organs: Secondary | ICD-10-CM | POA: Insufficient documentation

## 2017-09-19 DIAGNOSIS — Z13220 Encounter for screening for lipoid disorders: Secondary | ICD-10-CM

## 2017-09-19 MED ORDER — HYDROXYZINE HCL 25 MG PO TABS: 25.0000 mg | ORAL_TABLET | Freq: Three times a day (TID) | ORAL | 0 refills | Status: DC | PRN

## 2017-09-19 NOTE — Patient Instructions (Signed)
We will call you about the lab results. 

## 2017-09-19 NOTE — Progress Notes (Signed)
Subjective:     Patient ID: Lynford HumphreyCecile E Faeth, female   DOB: 10/08/1975, 42 y.o.   MRN: [id number] Chief Complaint  Patient presents with  . Dizziness    Patient comes in office today with complaints of dizziness for two weeks or more. Patient describes diziness as a "drunk feeling", patient states that it affects her when driving and has had nausea and vomiting.    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 They have intensified in the last two weeks. 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 Reports the dizziness is "inside her" and not vertiginous. Reports eye exam with new glasses in the past year.  Review of Systems     Objective:   Physical Exam  Constitutional: She appears well-developed and well-nourished. No distress.  HENT:  Ear canals patent; TM's intact  Neck: Carotid bruit is not present.  Cardiovascular: Normal rate and regular rhythm.  Pulmonary/Chest: Breath sounds normal.  Musculoskeletal: She exhibits no edema (of lower extremities).       Assessment:    1. Essential (primary) hypertension - Comprehensive metabolic panel  2. Dizziness - CBC with Differential/Platelet - Ferritin  3. Screening for cholesterol level - Lipid panel  4. Situational stress - hydrOXYzine (ATARAX/VISTARIL) 25 MG tablet; Take 1 tablet (25 mg total) by mouth 3 (three) times daily as needed for anxiety or nausea. For stress/anxiety  Dispense: 30 tablet; Refill: 0    Plan:    Further f/u pending lab results.

## 2017-10-03 ENCOUNTER — Encounter: Payer: Self-pay | Admitting: Family Medicine

## 2017-10-03 ENCOUNTER — Ambulatory Visit (INDEPENDENT_AMBULATORY_CARE_PROVIDER_SITE_OTHER): Payer: Self-pay | Admitting: Family Medicine

## 2017-10-03 VITALS — BP 122/84 | HR 78 | Temp 98.3°F | Resp 16 | Wt 130.6 lb

## 2017-10-03 DIAGNOSIS — R6884 Jaw pain: Secondary | ICD-10-CM

## 2017-10-03 DIAGNOSIS — R238 Other skin changes: Secondary | ICD-10-CM

## 2017-10-03 DIAGNOSIS — I10 Essential (primary) hypertension: Secondary | ICD-10-CM

## 2017-10-03 NOTE — Progress Notes (Signed)
Subjective:     Patient ID: Desiree [name], female   DOB: 01/09/1976, 42 y.o.   MRN: [id number] Chief Complaint  Patient presents with  . Nausea    Patient would like to address ongoing issue with nausea for the past several weeks.  . Skin Problem    Patient reports that she has noticed a "lump/bump" under her left ear, patient states this appeared a day ago and is tender to the touch.  . Pruritis    Patient would like to have her left shoulder looked at, patient reports irriation and itching of the skin and is concerned of a possible insect bite.    HPI States she decreased her bp medication to 1/2 pill and felt the nausea improved. Reports that she has quit smoking and not sure she needs bp medication any longer. States she has hx of TMJ/bruxism with her jaw locking up during her first pregnancy. Also is not up to date with the dentist. She has just started a new job and will not have insurance until May.  Review of Systems     Objective:   Physical Exam  Constitutional: She appears well-developed and well-nourished. No distress.  HENT:  Left TM intact without erythema Mild discomfort to her left upper jaw line but no significant swelling or overlying erythema. No tenderness on percussion of her teeth in this area. No salivary stone palpated. TMJ crepitus without pain  Skin:  Left upper shoulder with small patch of mild erythema. No papules, vesicles or scaling       Assessment:    1. Essential (primary) hypertension: discussed tapering off her HCTZ  2. Pain in upper jaw: most likely mediated by TMJ or dental issue 3. Skin irritation    Plan:    Take 1/2 HCTZ for a week then d/c. Nurse bp check a few days afterwards. Try Aleve two pills twice daily for your jaw. Hydrocortisone cream for skin irritation.

## 2017-10-03 NOTE — Patient Instructions (Signed)
Continue on HCTZ 1/2 pill day for a week then discontinue. Nurse bp check a few days thereafter. Use two generic Aleve twice daily with food for jaw discomfort. Try hydrocortisone cream for your shoulder irritation.

## 2017-10-15 ENCOUNTER — Telehealth: Payer: Self-pay | Admitting: Family Medicine

## 2017-10-15 NOTE — Telephone Encounter (Signed)
Patient states that she has a rash all over her body since last Friday.  She states that it itches and the more medicine that she puts on it her skin is starting to burn.  She states that she has little red bumps all over her body.  She states that she is taking hydroxyzine the last two nights to try to help her sleep.  She states that she can not take benadryl makes her "climb the walls" so she can't take it.  She states that she pulled some weeds the week before she saw you on 10/03/2017 and might have had poison oak in it from what she could tell from looking it up on the Internet.  She states that she is not sure if this is related to what she saw you for on 10/03/2017.  She is not sure if that was the start of this or not.  Please advise.

## 2017-10-15 NOTE — Telephone Encounter (Signed)
Spoke with patient and she feels that rash has worsened since she seen you last. She denies having the sensation of her throat closing, nausea, or numbness or tingling. Advised patient that she will need to come in for evaluation since rash has progressed since her last visit. Appt was scheduled for tomorrow (4/4) with Nadine CountsBob. Advised patient if symptoms worsen between now and appt time to go to the ER. Patient verbalized understanding.

## 2017-10-16 ENCOUNTER — Encounter: Payer: Self-pay | Admitting: Family Medicine

## 2017-10-16 ENCOUNTER — Ambulatory Visit (INDEPENDENT_AMBULATORY_CARE_PROVIDER_SITE_OTHER): Payer: Self-pay | Admitting: Family Medicine

## 2017-10-16 VITALS — BP 100/70 | HR 68 | Temp 98.2°F | Resp 15 | Wt 130.6 lb

## 2017-10-16 DIAGNOSIS — L309 Dermatitis, unspecified: Secondary | ICD-10-CM

## 2017-10-16 MED ORDER — PREDNISONE 10 MG PO TABS: ORAL_TABLET | ORAL | 0 refills | Status: DC

## 2017-10-16 NOTE — Patient Instructions (Addendum)
Discussed use of Claritin and hydroxyzine. If not improving in  24-48 hour start prednisone. Stop HCTZ as your blood pressure looks great today.

## 2017-10-16 NOTE — Progress Notes (Signed)
Subjective:     Patient ID: Desiree HumphreyCecile E Depuy, female   DOB: 01/07/1976, 42 y.o.   MRN: [id number] Chief Complaint  Patient presents with  . Rash    Patient comes in office today with complaint of rash and itching all over her body for the past two and half weeks. Patient states that she had exposure to what she believes is poison oak about 2.5 weeks ago while pick clovers in her front yard. Patient has been applying calomine lotion on skin and hydroxyzine at night to help her sleep.    HPI States she has gotten some relief from Aveeno topically and hydroxyzine. Also has tapered HCTZ to 1/2 pill daily per my instructions.  Review of Systems     Objective:   Physical Exam  Constitutional: She appears well-developed and well-nourished. No distress.  Skin:  A few discrete 1-2 mm. Slightly erythematous papules noted on arms and back. Legs and face spared.  Areas of irritation from scratching noted on mid-back but no distinct rash.       Assessment:    1. Dermatitis: ? Etiology-does not look like contact dermatitis.    Plan:    Discussed use of Claritin and hydroxyzine. If not improving to start prednisone 6 day taper sent in to her pharmacy. She will stop  HCTZ as she is normotensive

## 2017-12-15 ENCOUNTER — Other Ambulatory Visit: Payer: Self-pay | Admitting: Family Medicine

## 2018-01-20 ENCOUNTER — Other Ambulatory Visit: Payer: Self-pay | Admitting: Family Medicine

## 2018-01-20 ENCOUNTER — Telehealth: Payer: Self-pay | Admitting: Family Medicine

## 2018-01-20 NOTE — Telephone Encounter (Signed)
Patient advised and states that she will come on her lunch break today. KW

## 2018-01-20 NOTE — Telephone Encounter (Signed)
Please review. KW 

## 2018-01-20 NOTE — Telephone Encounter (Signed)
We discontinued this medication back in [place] as her blood pressure had normalized. Please ask her why she wishes to get back on medication. May need nurse bp check.

## 2018-01-20 NOTE — Telephone Encounter (Signed)
Was patient advised at her nurse visit? KW

## 2018-01-20 NOTE — Telephone Encounter (Signed)
Patient states that since she has been off her blood thinner and HCTZ she has noticed swelling on her arm. Patient states that she has concerns of clotting in her shoulder again and states that she wanted to take it as a pre-cautionary reason since she has noticed swelling. KW

## 2018-01-20 NOTE — Telephone Encounter (Signed)
Patient came in for a BP check. BP today was 132/78. Per Nadine CountsBob, patient is not to go on BP medication because BP is normal.

## 2018-01-20 NOTE — Telephone Encounter (Signed)
Pt contacted office for refill request on the following medications:  hydrochlorothiazide (HYDRODIURIL) 25 MG tablet  [facility] [address]  Last Rx: 09/11/17 LOV: 10/16/17 Please advise. Thanks TNP

## 2018-01-20 NOTE — Telephone Encounter (Signed)
The HCTZ will not help with clot prevention. Have her come in for a nurse bp check at her convenience.

## 2018-01-21 ENCOUNTER — Other Ambulatory Visit: Payer: Self-pay | Admitting: Family Medicine

## 2018-01-21 MED ORDER — HYDROCHLOROTHIAZIDE 25 MG PO TABS: 25.0000 mg | ORAL_TABLET | Freq: Every day | ORAL | 1 refills | Status: DC

## 2018-01-21 NOTE — Progress Notes (Signed)
Patient comes in for a BP check. She does mention to have swelling, but that is her only complaint. BP today is 132/78. She reports that she is no longer on HCTZ

## 2018-01-21 NOTE — Telephone Encounter (Signed)
Please advise patient was just seen in office for nurse blood pressure check yesterday and was told that she does not need to be on medication. KW

## 2018-01-21 NOTE — Telephone Encounter (Signed)
Pt does not want to go off her blood pressure medication.  She needs a refill  [facility] [place]  [place] [phone]  Thanks Barth Kirksteri

## 2018-01-21 NOTE — Telephone Encounter (Signed)
States she never stay off HCTZ as planned and has been taking it regularly. Will refill as requested.

## 2018-04-23 ENCOUNTER — Telehealth: Payer: Self-pay

## 2018-04-23 NOTE — Telephone Encounter (Signed)
-----   Message from Anola Gurney, [place] sent at 04/23/2018 10:26 AM EDT ----- Contact: [phone] Let her know I am not accepting new patients as I am winding down towards retirement. ----- Message ----- From: Milas Kocher Sent: 04/23/2018  10:20 AM EDT To: Anola Gurney, [place]  Pt is wanting to know if daughter Haset [place] 03/21/04) would be able to establish with you as her PCP.  States she would prefer to see you and not other providers that are accepting new patients.   States you see the rest of her family and she is getting out of PEDS and needs a PCP

## 2018-04-23 NOTE — Telephone Encounter (Signed)
[id number] 

## 2018-04-27 NOTE — Telephone Encounter (Signed)
Patient advised.KW 

## 2018-07-18 ENCOUNTER — Other Ambulatory Visit: Payer: Self-pay | Admitting: Family Medicine

## 2019-01-18 ENCOUNTER — Other Ambulatory Visit: Payer: Self-pay | Admitting: Family Medicine

## 2019-01-18 MED ORDER — HYDROCHLOROTHIAZIDE 25 MG PO TABS: 25.0000 mg | ORAL_TABLET | Freq: Every day | ORAL | 0 refills | Status: DC

## 2019-01-18 NOTE — Telephone Encounter (Signed)
Bob's old patient. Patient states that she will be out of her medication on next Wednesday and she schedule a appointment with Dr. Jacinto Reap on 01/27/2019 @ 1:40 PM.

## 2019-01-18 NOTE — Telephone Encounter (Signed)
[facility] for [facility] following medications:  hydrochlorothiazide (HYDRODIURIL) 25 MG tablet  Please advise.  

## 2019-01-25 ENCOUNTER — Telehealth: Payer: Self-pay

## 2019-01-25 NOTE — Telephone Encounter (Signed)
Please send [email or web address] link

## 2019-01-27 ENCOUNTER — Ambulatory Visit (INDEPENDENT_AMBULATORY_CARE_PROVIDER_SITE_OTHER): Payer: Self-pay | Admitting: Family Medicine

## 2019-01-27 VITALS — BP 128/82 | HR 58

## 2019-01-27 DIAGNOSIS — I10 Essential (primary) hypertension: Secondary | ICD-10-CM

## 2019-01-27 MED ORDER — HYDROCHLOROTHIAZIDE 25 MG PO TABS: 25.0000 mg | ORAL_TABLET | Freq: Every day | ORAL | 1 refills | Status: DC

## 2019-01-27 NOTE — Progress Notes (Signed)
Patient: Desiree [name] Female    DOB: 01/19/1976   43 y.o.   MRN: [id number] Visit Date: 01/28/2019  Today's Provider: Shirlee LatchAngela Amaree Leeper, MD   Chief Complaint  Patient presents with  . Hypertension   Subjective:    I, Porsha McClurkin CMA, am acting as a scribe for Shirlee LatchAngela Anner Baity, MD.    Virtual Visit via Video Note  I connected with Desiree [name] on 01/28/19 at  1:40 PM EDT by a video enabled telemedicine application and verified that I am speaking with the correct person using two identifiers.   Patient location: home Provider [facility] Persons involved in the visit: patient, provider   I discussed the limitations of evaluation and management by telemedicine and the availability of in person appointments. The patient expressed understanding and agreed to proceed.  HPI  Hypertension, follow-up:  BP Readings from Last 3 Encounters:  01/27/19 128/82  01/20/18 132/78  10/16/17 100/70    She was last seen for hypertension 16 months ago.  BP at that visit was 122/84. Management changes since that visit include none. She reports good compliance with treatment. She is not having side effects.  She is exercising. She is adherent to low salt diet.   Outside blood pressures are not regularly checked. She is experiencing none.  Patient denies chest pain, chest pressure/discomfort, exertional chest pressure/discomfort, fatigue, irregular heart beat, lower extremity edema and palpitations.   Cardiovascular risk factors include hypertension.  Use of agents associated with hypertension: none.     Weight trend: stable Wt Readings from Last 3 Encounters:  10/16/17 130 lb 9.6 oz (59.2 kg)  10/03/17 130 lb 9.6 oz (59.2 kg)  09/19/17 120 lb (54.4 kg)    Current diet: in general, a "healthy" diet    ------------------------------------------------------------------------  Has h/o thoracic outlet syndrome.  Believes that BP is much etter  since she quit smoking after this. Does have family history of T2DM. Has been followed by Vascular surgery since that time.    Allergies  Allergen Reactions  . Venlafaxine     Increases Blood Pressure     Current Outpatient Medications:  .  hydrochlorothiazide (HYDRODIURIL) 25 MG tablet, Take 1 tablet (25 mg total) by mouth daily., Disp: 90 tablet, Rfl: 1  Review of Systems  Constitutional: Negative.   Respiratory: Negative.   Cardiovascular: Negative.   Genitourinary: Negative.   Neurological: Negative.     Social History   Tobacco Use  . Smoking status: Former Smoker    Packs/day: 0.50    Years: 20.00    Pack years: 10.00    Types: Cigarettes    Quit date: 05/14/2016    Years since quitting: 2.7  . Smokeless tobacco: Never Used  Substance Use Topics  . Alcohol use: No    Alcohol/week: 0.0 standard drinks    Comment: OCCASIONALLY      Objective:   BP 128/82   Pulse (!) 58  Vitals:   01/27/19 1406  BP: 128/82  Pulse: (!) 58   Taken by patient while on video call.  Physical Exam Vitals signs reviewed.  Constitutional:      Appearance: Normal appearance.  Pulmonary:     Effort: Pulmonary effort is normal. No respiratory distress.  Neurological:     Mental Status: She is alert and oriented to person, place, and time. Mental status is at baseline.  Psychiatric:        Mood and Affect: Mood normal.  Behavior: Behavior normal.     No results found for any visits on 01/27/19.     Assessment & Plan    I discussed the assessment and treatment plan with the patient. The patient was provided an opportunity to ask questions and all were answered. The patient agreed with the plan and demonstrated an understanding of the instructions.   The patient was advised to call back or seek an in-person evaluation if the symptoms worsen or if the condition fails to improve as anticipated.  Problem List Items Addressed This Visit      Cardiovascular and  Mediastinum   Essential (primary) hypertension - Primary    Well-controlled Continue HCTZ 25 mg daily Recheck metabolic panel Screening lipid panel Follow-up in 6 months      Relevant Medications   hydrochlorothiazide (HYDRODIURIL) 25 MG tablet   Other Relevant Orders   Comprehensive metabolic panel   Lipid panel       Return in about 6 months (around 07/30/2019) for CPE.   The entirety of the information documented in the History of Present Illness, Review of Systems and Physical Exam were personally obtained by me. Portions of this information were initially documented by [facility], CMA and reviewed by me for thoroughness and accuracy.    Piedad Standiford, Dionne Bucy, MD MPH [facility]

## 2019-01-28 ENCOUNTER — Ambulatory Visit: Payer: Self-pay | Admitting: Family Medicine

## 2019-01-28 NOTE — Assessment & Plan Note (Signed)
Well-controlled Continue HCTZ 25 mg daily Recheck metabolic panel Screening lipid panel Follow-up in 6 months

## 2019-04-01 ENCOUNTER — Other Ambulatory Visit: Payer: Self-pay

## 2019-04-01 DIAGNOSIS — Z20822 Contact with and (suspected) exposure to covid-19: Secondary | ICD-10-CM

## 2019-04-02 LAB — NOVEL CORONAVIRUS, NAA: SARS-CoV-2, NAA: NOT DETECTED

## 2019-06-27 ENCOUNTER — Other Ambulatory Visit: Payer: Self-pay | Admitting: Family Medicine

## 2019-07-21 ENCOUNTER — Encounter: Payer: Self-pay | Admitting: Family Medicine

## 2019-07-23 DIAGNOSIS — Z20822 Contact with and (suspected) exposure to covid-19: Secondary | ICD-10-CM | POA: Diagnosis not present

## 2019-07-23 DIAGNOSIS — Z03818 Encounter for observation for suspected exposure to other biological agents ruled out: Secondary | ICD-10-CM | POA: Diagnosis not present

## 2019-07-27 ENCOUNTER — Encounter: Payer: Self-pay | Admitting: Family Medicine

## 2019-07-27 ENCOUNTER — Ambulatory Visit (INDEPENDENT_AMBULATORY_CARE_PROVIDER_SITE_OTHER): Payer: Medicaid Other | Admitting: Family Medicine

## 2019-07-27 VITALS — BP 120/78 | HR 82

## 2019-07-27 DIAGNOSIS — I10 Essential (primary) hypertension: Secondary | ICD-10-CM

## 2019-07-27 MED ORDER — HYDROCHLOROTHIAZIDE 25 MG PO TABS: 25.0000 mg | ORAL_TABLET | Freq: Every day | ORAL | 1 refills | Status: DC

## 2019-07-27 NOTE — Progress Notes (Signed)
Patient: Desiree [name] Female    DOB: 1976/06/13   44 y.o.   MRN: [id number] Visit Date: 07/27/2019  Today's Provider: Shirlee Latch, MD   Chief Complaint  Patient presents with  . Hypertension   Subjective:    I [place] S. Dimas, CMA, am acting as scribe for Shirlee Latch, MD.  Virtual Visit via Telephone Note  I connected with Desiree [name] on 07/27/19 at 11:20 AM EST by telephone and verified that I am speaking with the correct person using two identifiers.  Location: Patient location: home Provider location: [facility] Persons involved in the visit: patient, provider   I discussed the limitations, risks, security and privacy concerns of performing an evaluation and management service by telephone and the availability of in person appointments. I also discussed with the patient that there may be a patient responsible charge related to this service. The patient expressed understanding and agreed to proceed.   HPI  Hypertension, follow-up:  BP Readings from Last 3 Encounters:  07/27/19 120/78  01/27/19 128/82  01/20/18 132/78    She was last seen for hypertension 6 months ago.  BP at that visit was 128/82. Management changes since that visit include no changes. She reports excellent compliance with treatment. She is not having side effects.  She is exercising. She is adherent to low salt diet.   Outside blood pressures are stable. She is experiencing none.  Patient denies chest pain and lower extremity edema.   Cardiovascular risk factors include hypertension.  Use of agents associated with hypertension: none.     Weight trend: stable Wt Readings from Last 3 Encounters:  10/16/17 130 lb 9.6 oz (59.2 kg)  10/03/17 130 lb 9.6 oz (59.2 kg)  09/19/17 120 lb (54.4 kg)    Current diet: in general, a "healthy" diet    ------------------------------------------------------------------------   Allergies  Allergen Reactions  .  Venlafaxine     Increases Blood Pressure     Current Outpatient Medications:  .  hydrochlorothiazide (HYDRODIURIL) 25 MG tablet, TAKE 1 TABLET BY MOUTH EVERY DAY, Disp: 90 tablet, Rfl: 1  Review of Systems  Constitutional: Negative.   Respiratory: Negative.   Cardiovascular: Negative.     Social History   Tobacco Use  . Smoking status: Former Smoker    Packs/day: 0.50    Years: 20.00    Pack years: 10.00    Types: Cigarettes    Quit date: 05/14/2016    Years since quitting: 3.2  . Smokeless tobacco: Never Used  Substance Use Topics  . Alcohol use: No    Alcohol/week: 0.0 standard drinks    Comment: OCCASIONALLY      Objective:   BP 120/78   Pulse 82  Vitals:   07/27/19 0904  BP: 120/78  Pulse: 82  There is no height or weight on file to calculate BMI.   Physical Exam Speaking in full sentences in no apparent distress  No results found for any visits on 07/27/19.     Assessment & Plan     Follow Up Instructions: I discussed the assessment and treatment plan with the patient. The patient was provided an opportunity to ask questions and all were answered. The patient agreed with the plan and demonstrated an understanding of the instructions.   The patient was advised to call back or seek an in-person evaluation if the symptoms worsen or if the condition fails to improve as anticipated.   Problem List Items Addressed This Visit  Cardiovascular and Mediastinum   Essential (primary) hypertension - Primary    Well-controlled Continue HCTZ 25 mg daily-refills given today She was instructed to check metabolic panel 6 months ago when she was seen, but her labs were not done, so instructed her again about getting metabolic panel and the screening lipid panel when she is able Follow-up in 3 to 6 months for CPE      Relevant Medications   hydrochlorothiazide (HYDRODIURIL) 25 MG tablet       Return in about 3 months (around 10/25/2019) for CPE.   The  entirety of the information documented in the History of Present Illness, Review of Systems and Physical Exam were personally obtained by me. Portions of this information were initially documented by Lynford Humphrey, CMA and reviewed by me for thoroughness and accuracy.    Desiree [name], Dionne Bucy, MD MPH [facility]

## 2019-07-27 NOTE — Assessment & Plan Note (Signed)
Well-controlled Continue HCTZ 25 mg daily-refills given today She was instructed to check metabolic panel 6 months ago when she was seen, but her labs were not done, so instructed her again about getting metabolic panel and the screening lipid panel when she is able Follow-up in 3 to 6 months for CPE

## 2019-08-04 DIAGNOSIS — Z20822 Contact with and (suspected) exposure to covid-19: Secondary | ICD-10-CM | POA: Diagnosis not present

## 2019-08-04 DIAGNOSIS — Z03818 Encounter for observation for suspected exposure to other biological agents ruled out: Secondary | ICD-10-CM | POA: Diagnosis not present

## 2019-08-25 ENCOUNTER — Ambulatory Visit
Admission: RE | Admit: 2019-08-25 | Discharge: 2019-08-25 | Disposition: A | Discharge status: Home / Self Care | Payer: Self-pay | Source: Ambulatory Visit | Attending: Family Medicine | Admitting: Family Medicine

## 2019-08-25 ENCOUNTER — Other Ambulatory Visit: Payer: Self-pay

## 2019-08-25 ENCOUNTER — Ambulatory Visit (INDEPENDENT_AMBULATORY_CARE_PROVIDER_SITE_OTHER): Payer: Medicaid Other | Admitting: Family Medicine

## 2019-08-25 ENCOUNTER — Encounter: Payer: Self-pay | Admitting: Family Medicine

## 2019-08-25 VITALS — BP 147/84 | HR 61 | Temp 96.8°F | Wt 129.0 lb

## 2019-08-25 DIAGNOSIS — R350 Frequency of micturition: Secondary | ICD-10-CM

## 2019-08-25 DIAGNOSIS — R109 Unspecified abdominal pain: Secondary | ICD-10-CM

## 2019-08-25 LAB — POCT URINALYSIS DIPSTICK
Bilirubin, UA: NEGATIVE
Blood, UA: NEGATIVE
Glucose, UA: NEGATIVE
Ketones, UA: NEGATIVE
Leukocytes, UA: NEGATIVE
Nitrite, UA: NEGATIVE
Protein, UA: NEGATIVE
Spec Grav, UA: <=1.005 — AB (ref 1.010–1.025)
Urobilinogen, UA: 0.2 E.U./dL
pH, UA: 6.5 (ref 5.0–8.0)

## 2019-08-25 MED ORDER — HYDROCODONE-ACETAMINOPHEN 5-325 MG PO TABS: 1.0000 | ORAL_TABLET | Freq: Four times a day (QID) | ORAL | 0 refills | Status: AC | PRN

## 2019-08-25 MED ORDER — TAMSULOSIN HCL 0.4 MG PO CAPS: 0.4000 mg | ORAL_CAPSULE | Freq: Every day | ORAL | 0 refills | Status: DC

## 2019-08-25 NOTE — Progress Notes (Signed)
Patient: Desiree [name] Female    DOB: 1975/09/04   44 y.o.   MRN: [id number] Visit Date: 08/25/2019  Today's Provider: Shirlee Latch, MD   Chief Complaint  Patient presents with   Back Pain   Urinary Tract Infection   Subjective:     Urinary Tract Infection  This is a new problem. The current episode started today. The problem has been gradually worsening. The quality of the pain is described as stabbing. The pain is severe. She is not sexually active. There is no history of pyelonephritis. Associated symptoms include frequency, nausea and urgency. Pertinent negatives include no flank pain, hematuria or vomiting. She has tried NSAIDs for the symptoms. The treatment provided mild relief.   Patient was awoken from sleep abruptly at 4 AM this morning with right flank pain that is stabbing in nature.  She was unable to get comfortable.  She has history of kidney stones and this feels similar to how that felt.  She tried Motrin and got mild relief from this.  She has no history of pyelonephritis or STDs.  She does have a little bit of urinary frequency today, but no dysuria, hematuria, vomiting.   Allergies  Allergen Reactions   Venlafaxine     Increases Blood Pressure     Current Outpatient Medications:    hydrochlorothiazide (HYDRODIURIL) 25 MG tablet, Take 1 tablet (25 mg total) by mouth daily., Disp: 90 tablet, Rfl: 1   HYDROcodone-acetaminophen (NORCO) 5-325 MG tablet, Take 1 tablet by mouth every 6 (six) hours as needed for up to 5 days for moderate pain., Disp: 20 tablet, Rfl: 0   tamsulosin (FLOMAX) 0.4 MG CAPS capsule, Take 1 capsule (0.4 mg total) by mouth daily., Disp: 15 capsule, Rfl: 0  Review of Systems  Constitutional: Negative.   Gastrointestinal: Positive for nausea. Negative for abdominal distention, abdominal pain, anal bleeding, blood in stool, constipation, diarrhea, rectal pain and vomiting.  Genitourinary: Positive for frequency and urgency.  Negative for decreased urine volume, difficulty urinating, dysuria, flank pain, hematuria, vaginal bleeding, vaginal discharge and vaginal pain.  Musculoskeletal: Positive for back pain (Right lower back pain).    Social History   Tobacco Use   Smoking status: Former Smoker    Packs/day: 0.50    Years: 20.00    Pack years: 10.00    Types: Cigarettes    Quit date: 05/14/2016    Years since quitting: 3.2   Smokeless tobacco: Never Used  Substance Use Topics   Alcohol use: No    Alcohol/week: 0.0 standard drinks    Comment: OCCASIONALLY      Objective:   BP (!) 147/84 (BP Location: Right Arm, Patient Position: Sitting, Cuff Size: Normal)    Pulse 61    Temp (!) 96.8 F (36 C) (Temporal)    Wt 129 lb (58.5 kg)    BMI 22.14 kg/m  Vitals:   08/25/19 1450  BP: (!) 147/84  Pulse: 61  Temp: (!) 96.8 F (36 C)  TempSrc: Temporal  Weight: 129 lb (58.5 kg)  Body mass index is 22.14 kg/m.   Physical Exam Constitutional:      General: She is not in acute distress.    Appearance: Normal appearance. She is not diaphoretic.  HENT:     Head: Normocephalic and atraumatic.  Eyes:     General: No scleral icterus.    Conjunctiva/sclera: Conjunctivae normal.  Cardiovascular:     Rate and Rhythm: Normal rate and regular rhythm.  Pulses: Normal pulses.     Heart sounds: Normal heart sounds. No murmur.  Pulmonary:     Effort: Pulmonary effort is normal. No respiratory distress.     Breath sounds: Normal breath sounds. No wheezing.  Abdominal:     General: There is no distension.     Palpations: Abdomen is soft.     Tenderness: There is no abdominal tenderness. There is right CVA tenderness. There is no left CVA tenderness.  Musculoskeletal:     Cervical back: Neck supple.     Right lower leg: No edema.     Left lower leg: No edema.  Lymphadenopathy:     Cervical: No cervical adenopathy.  Skin:    General: Skin is warm and dry.     Findings: No rash.  Neurological:      Mental Status: She is alert and oriented to person, place, and time. Mental status is at baseline.  Psychiatric:        Mood and Affect: Mood normal.        Behavior: Behavior normal.      Results for orders placed or performed in visit on 08/25/19  POCT urinalysis dipstick  Result Value Ref Range   Color, UA     Clarity, UA     Glucose, UA Negative Negative   Bilirubin, UA Negative    Ketones, UA Negative    Spec Grav, UA <=1.005 (A) 1.010 - 1.025   Blood, UA Negative    pH, UA 6.5 5.0 - 8.0   Protein, UA Negative Negative   Urobilinogen, UA 0.2 0.2 or 1.0 E.U./dL   Nitrite, UA Negative    Leukocytes, UA Negative Negative   Appearance     Odor         Assessment & Plan    1. Acute right flank pain 2. Urinary frequency -UA is benign, no signs of infection -Patient's history and exam are consistent with possible nephrolithiasis, however -She reports having previous radiopaque kidney stone, so we will get KUB to evaluate size -Start Flomax to help her pass it -Continue ibuprofen for pain and use Norco as needed for breakthrough pain -Return precautions discussed - DG Abd 1 View; Future   Meds ordered this encounter  Medications   HYDROcodone-acetaminophen (NORCO) 5-325 MG tablet    Sig: Take 1 tablet by mouth every 6 (six) hours as needed for up to 5 days for moderate pain.    Dispense:  20 tablet    Refill:  0   tamsulosin (FLOMAX) 0.4 MG CAPS capsule    Sig: Take 1 capsule (0.4 mg total) by mouth daily.    Dispense:  15 capsule    Refill:  0     Return if symptoms worsen or fail to improve.   The entirety of the information documented in the History of Present Illness, Review of Systems and Physical Exam were personally obtained by me. Portions of this information were initially documented by Ashley Royalty, CMA and reviewed by me for thoroughness and accuracy.    Janaisa Birkland, Dionne Bucy, MD MPH [facility]

## 2019-08-25 NOTE — Patient Instructions (Signed)
Kidney Stones  Kidney stones are solid, rock-like deposits that form inside of the kidneys. The kidneys are a pair of organs that make urine. A kidney stone may form in a kidney and move into other parts of the urinary tract, including the tubes that connect the kidneys to the bladder (ureters), the bladder, and the tube that carries urine out of the body (urethra). As the stone moves through these areas, it can cause intense pain and block the flow of urine. Kidney stones are created when high levels of certain minerals are found in the urine. The stones are usually passed out of the body through urination, but in some cases, medical treatment may be needed to remove them. What are the causes? Kidney stones may be caused by:  A condition in which certain glands produce too much parathyroid hormone (primary hyperparathyroidism), which causes too much calcium buildup in the blood.  A buildup of uric acid crystals in the bladder (hyperuricosuria). Uric acid is a chemical that the body produces when you eat certain foods. It usually exits the body in the urine.  Narrowing (stricture) of one or both of the ureters.  A kidney blockage that is present at birth (congenital obstruction).  Past surgery on the kidney or the ureters, such as gastric bypass surgery. What increases the risk? The following factors may make you more likely to develop this condition:  Having had a kidney stone in the past.  Having a family history of kidney stones.  Not drinking enough water.  Eating a diet that is high in protein, salt (sodium), or sugar.  Being overweight or obese. What are the signs or symptoms? Symptoms of a kidney stone may include:  Pain in the side of the abdomen, right below the ribs (flank pain). Pain usually spreads (radiates) to the groin.  Needing to urinate frequently or urgently.  Painful urination.  Blood in the urine (hematuria).  Nausea.  Vomiting.  Fever and chills. How  is this diagnosed? This condition may be diagnosed based on:  Your symptoms and medical history.  A physical exam.  Blood tests.  Urine tests. These may be done before and after the stone passes out of your body through urination.  Imaging tests, such as a CT scan, abdominal X-ray, or ultrasound.  A procedure to examine the inside of the bladder (cystoscopy). How is this treated? Treatment for kidney stones depends on the size, location, and makeup of the stones. Kidney stones will often pass out of the body through urination. You may need to:  Increase your fluid intake to help pass the stone. In some cases, you may be given fluids through an IV and may need to be monitored at the hospital.  Take medicine for pain.  Make changes in your diet to help prevent kidney stones from coming back. Sometimes, medical procedures are needed to remove a kidney stone. This may involve:  A procedure to break up kidney stones using: ? A focused beam of light (laser therapy). ? Shock waves (extracorporeal shock wave lithotripsy).  Surgery to remove kidney stones. This may be needed if you have severe pain or have stones that block your urinary tract. Follow these instructions at home: Medicines  Take over-the-counter and prescription medicines only as told by your health care provider.  Ask your health care provider if the medicine prescribed to you requires you to avoid driving or using heavy machinery. Eating and drinking  Drink enough fluid to keep your urine pale yellow.   You may be instructed to drink at least 8-10 glasses of water each day. This will help you pass the kidney stone.  If directed, change your diet. This may include: ? Limiting how much sodium you eat. ? Eating more fruits and vegetables. ? Limiting how much animal protein--such as red meat, poultry, fish, and eggs--you eat.  Follow instructions from your health care provider about eating or drinking  restrictions. General instructions  Collect urine samples as told by your health care provider. You may need to collect a urine sample: ? 24 hours after you pass the stone. ? 8-12 weeks after passing the kidney stone, and every 6-12 months after that.  Strain your urine every time you urinate, for as long as directed. Use the strainer that your health care provider recommends.  Do not throw out the kidney stone after passing it. Keep the stone so it can be tested by your health care provider. Testing the makeup of your kidney stone may help prevent you from getting kidney stones in the future.  Keep all follow-up visits as told by your health care provider. This is important. You may need follow-up X-rays or ultrasounds to make sure that your stone has passed. How is this prevented? To prevent another kidney stone:  Drink enough fluid to keep your urine pale yellow. This is the best way to prevent kidney stones.  Eat a healthy diet and follow recommendations from your health care provider about foods to avoid. You may be instructed to eat a low-protein diet. Recommendations vary depending on the type of kidney stone that you have.  Maintain a healthy weight. Where to find more information  National Kidney Foundation (NKF): www.kidney.org  [employer] (UCF): www.urologyhealth.org Contact a health care provider if:  You have pain that gets worse or does not get better with medicine. Get help right away if:  You have a fever or chills.  You develop severe pain.  You develop new abdominal pain.  You faint.  You are unable to urinate. Summary  Kidney stones are solid, rock-like deposits that form inside of the kidneys.  Kidney stones can cause nausea, vomiting, blood in the urine, abdominal pain, and the urge to urinate frequently.  Treatment for kidney stones depends on the size, location, and makeup of the stones. Kidney stones will often pass out of the body  through urination.  Kidney stones can be prevented by drinking enough fluids, eating a healthy diet, and maintaining a healthy weight. This information is not intended to replace advice given to you by your health care provider. Make sure you discuss any questions you have with your health care provider. Document Revised: 11/17/2018 Document Reviewed: 11/17/2018 Elsevier Patient Education  2020 Elsevier Inc.  

## 2019-08-26 ENCOUNTER — Telehealth: Payer: Self-pay

## 2019-08-26 NOTE — Telephone Encounter (Signed)
-----   Message from Erasmo Downer, MD sent at 08/26/2019 10:46 AM EST ----- Small R kidney stone is noted as well as possible stone in tube from kidney to bladder.  This correlates with her pain.  Management as discussed Patient says ok to leave VM with results

## 2019-08-26 NOTE — Telephone Encounter (Signed)
Pt did view her xray  results on mychart however she does not understand the results and would like a callback

## 2019-08-26 NOTE — Telephone Encounter (Signed)
Seen by patient Desiree [name] on 08/26/2019 11:00 AM EST

## 2019-08-26 NOTE — Telephone Encounter (Signed)
Patient advised as below.  

## 2019-09-01 ENCOUNTER — Other Ambulatory Visit: Payer: Self-pay | Admitting: Family Medicine

## 2019-09-01 NOTE — Telephone Encounter (Signed)
Requested medication (s) are due for refill today: No  Requested medication (s) are on the active medication list: Yes  Last refill:  08/25/19  Future visit scheduled: No  Notes to clinic:  See request.    Requested Prescriptions  Pending Prescriptions Disp Refills   tamsulosin (FLOMAX) 0.4 MG CAPS capsule [Pharmacy Med Name: TAMSULOSIN HCL 0.4 MG CAPSULE] 15 capsule 0    Sig: TAKE 1 CAPSULE BY MOUTH EVERY DAY      Urology: Alpha-Adrenergic Blocker Failed - 09/01/2019 12:31 PM      Failed - Last BP in normal range    BP Readings from Last 1 Encounters:  08/25/19 (!) 147/84          Passed - Valid encounter within last 12 months    Recent Outpatient Visits           1 week ago Acute right flank pain   [facility] [place], Marzella Schlein, MD   1 month ago Essential (primary) hypertension   [facility], Marzella Schlein, MD   7 months ago Essential (primary) hypertension   [facility], Marzella Schlein, MD   1 year ago Dermatitis   [facility] [place], [place]   1 year ago Essential (primary) hypertension   [facility] [place], [place], [place]

## 2019-09-02 NOTE — Telephone Encounter (Signed)
Please check in with patient.  She should be done with this as it was only for during kidney stone

## 2019-09-02 NOTE — Telephone Encounter (Signed)
Patient stated she is returning a call to the office regarding her medication.  Please call back at [phone]

## 2019-09-02 NOTE — Telephone Encounter (Signed)
LMTCB

## 2019-09-03 NOTE — Telephone Encounter (Signed)
Patient reports that she did not request refill. Patient reports that she still has medication from first prescription. Patient reports that she is not sure that she has passed the stones.

## 2019-09-19 ENCOUNTER — Other Ambulatory Visit: Payer: Self-pay | Admitting: Family Medicine

## 2019-09-19 NOTE — Telephone Encounter (Signed)
Requested medication (s) are due for refill today: yes  Requested medication (s) are on the active medication list: yes   Future visit scheduled: no  Notes to clinic:  REQUEST FOR 90 DAYS PRESCRIPTION   Requested Prescriptions  Pending Prescriptions Disp Refills   tamsulosin (FLOMAX) 0.4 MG CAPS capsule [Pharmacy Med Name: TAMSULOSIN HCL 0.4 MG CAPSULE] 90 capsule 1    Sig: TAKE 1 CAPSULE BY MOUTH EVERY DAY      Urology: Alpha-Adrenergic Blocker Failed - 09/19/2019  1:02 PM      Failed - Last BP in normal range    BP Readings from Last 1 Encounters:  08/25/19 (!) 147/84          Passed - Valid encounter within last 12 months    Recent Outpatient Visits           3 weeks ago Acute right flank pain   [facility], Marzella Schlein, MD   1 month ago Essential (primary) hypertension   [facility], Marzella Schlein, MD   7 months ago Essential (primary) hypertension   [facility], Marzella Schlein, MD   1 year ago Dermatitis   [facility] [place], [place]   1 year ago Essential (primary) hypertension   [facility] [place], [place], [place]

## 2020-01-11 ENCOUNTER — Other Ambulatory Visit: Payer: Self-pay | Admitting: Family Medicine

## 2020-01-11 MED ORDER — HYDROCHLOROTHIAZIDE 25 MG PO TABS: 25.0000 mg | ORAL_TABLET | Freq: Every day | ORAL | 0 refills | Status: DC

## 2020-01-11 NOTE — Telephone Encounter (Signed)
Copied from CRM [id number]. Topic: Quick Communication - Rx Refill/Question >> Jan 11, 2020 11:16 AM Jaquita Rector A wrote: Medication: hydrochlorothiazide (HYDRODIURIL) 25 MG tablet    Has the patient contacted their pharmacy? Yes.   (Agent: If no, request that the patient contact the pharmacy for the refill.) (Agent: If yes, when and what did the pharmacy advise?)  Preferred Pharmacy (with phone number or street name): [id number] [place], [place] - [address]  Phone:  [phone] Fax:  [phone]     Agent: Please be advised that RX refills may take up to 3 business days. We ask that you follow-up with your pharmacy.

## 2020-01-24 DIAGNOSIS — N200 Calculus of kidney: Secondary | ICD-10-CM | POA: Diagnosis not present

## 2020-01-24 DIAGNOSIS — M549 Dorsalgia, unspecified: Secondary | ICD-10-CM | POA: Diagnosis not present

## 2020-03-03 ENCOUNTER — Encounter: Payer: Self-pay | Admitting: Family Medicine

## 2020-03-03 ENCOUNTER — Ambulatory Visit
Admission: RE | Admit: 2020-03-03 | Discharge: 2020-03-03 | Disposition: A | Discharge status: Home / Self Care | Payer: BC Managed Care – PPO | Source: Ambulatory Visit | Attending: Family Medicine | Admitting: Family Medicine

## 2020-03-03 ENCOUNTER — Ambulatory Visit
Admission: RE | Admit: 2020-03-03 | Discharge: 2020-03-03 | Disposition: A | Discharge status: Home / Self Care | Payer: BC Managed Care – PPO | Attending: Family Medicine | Admitting: Family Medicine

## 2020-03-03 ENCOUNTER — Other Ambulatory Visit: Payer: Self-pay

## 2020-03-03 ENCOUNTER — Ambulatory Visit (INDEPENDENT_AMBULATORY_CARE_PROVIDER_SITE_OTHER): Payer: BC Managed Care – PPO | Admitting: Family Medicine

## 2020-03-03 VITALS — BP 110/80 | HR 62 | Temp 98.1°F | Resp 16 | Ht 62.0 in | Wt 130.0 lb

## 2020-03-03 DIAGNOSIS — R109 Unspecified abdominal pain: Secondary | ICD-10-CM | POA: Diagnosis not present

## 2020-03-03 DIAGNOSIS — K5641 Fecal impaction: Secondary | ICD-10-CM | POA: Diagnosis not present

## 2020-03-03 DIAGNOSIS — M62830 Muscle spasm of back: Secondary | ICD-10-CM

## 2020-03-03 DIAGNOSIS — N200 Calculus of kidney: Secondary | ICD-10-CM | POA: Diagnosis not present

## 2020-03-03 LAB — POCT URINALYSIS DIPSTICK
Bilirubin, UA: NEGATIVE
Blood, UA: NEGATIVE
Glucose, UA: NEGATIVE
Ketones, UA: NEGATIVE
Leukocytes, UA: NEGATIVE
Nitrite, UA: NEGATIVE
Protein, UA: NEGATIVE
Spec Grav, UA: 1.02 (ref 1.010–1.025)
Urobilinogen, UA: 0.2 E.U./dL
pH, UA: 7 (ref 5.0–8.0)

## 2020-03-03 MED ORDER — CYCLOBENZAPRINE HCL 10 MG PO TABS: 10.0000 mg | ORAL_TABLET | Freq: Three times a day (TID) | ORAL | 0 refills | Status: AC | PRN

## 2020-03-03 NOTE — Patient Instructions (Signed)

## 2020-03-03 NOTE — Progress Notes (Signed)
Established patient visit   Patient: Desiree [name]   DOB: 06/29/1976   44 y.o. Female  MRN: [id number] Visit Date: 03/03/2020  Today's healthcare provider: Shirlee Latch, MD   I,Sulibeya S Dimas,acting as a scribe for Shirlee Latch, MD.,have documented all relevant documentation on the behalf of Shirlee Latch, MD,as directed by  Shirlee Latch, MD while in the presence of Shirlee Latch, MD.  Chief Complaint  Patient presents with  . Abdominal Pain   Subjective    HPI  Patient here today C/O recurrent right upper abdominal pain radiating to her right lower back. Patient reports symptoms have been present on and off since 08/25/2019. Patient reports nausea, vomiting and "thightness around right rib area."  Patient reports exercise makes pain worse. Patient reports nausea and vomiting after eating red sauce. Patient reports taking flomax as prescribed, reports no symptom changed. Patient denies any UTI symptoms, no blood in urine.   Patient Active Problem List   Diagnosis Date Noted  . Hx of thoracic outlet syndrome 09/19/2017  . DVT of upper extremity (deep vein thrombosis), left 11/30/2015  . Atypical squamous cells of undetermined significance (ASC-US) on cervical Pap smear 12/28/2014  . Affective bipolar disorder (HCC) 12/28/2014  . H/O renal calculi 12/28/2014  . Essential (primary) hypertension 12/28/2014   Social History   Tobacco Use  . Smoking status: Former Smoker    Packs/day: 0.50    Years: 20.00    Pack years: 10.00    Types: Cigarettes    Quit date: 05/14/2016    Years since quitting: 3.8  . Smokeless tobacco: Never Used  Substance Use Topics  . Alcohol use: No    Alcohol/week: 0.0 standard drinks    Comment: OCCASIONALLY  . Drug use: No   Allergies  Allergen Reactions  . Venlafaxine     Increases Blood Pressure       Medications: Outpatient Medications Prior to Visit  Medication Sig  . hydrochlorothiazide (HYDRODIURIL) 25 MG  tablet Take 1 tablet (25 mg total) by mouth daily.  . tamsulosin (FLOMAX) 0.4 MG CAPS capsule TAKE 1 CAPSULE BY MOUTH EVERY DAY (Patient not taking: Reported on 03/03/2020)   No facility-administered medications prior to visit.    Review of Systems  Constitutional: Positive for activity change. Negative for chills, fatigue and fever.  Respiratory: Negative for cough, chest tightness and shortness of breath.   Cardiovascular: Negative for chest pain and palpitations.  Gastrointestinal: Positive for abdominal distention, nausea and vomiting. Negative for abdominal pain, blood in stool, constipation and diarrhea.  Genitourinary: Positive for flank pain. Negative for frequency, hematuria and vaginal discharge.  Musculoskeletal: Positive for back pain.    Last CBC Lab Results  Component Value Date   WBC 6.0 11/25/2015   HGB 11.3 (L) 11/25/2015   HCT 33.1 (L) 11/25/2015   MCV 91.5 11/25/2015   MCH 31.4 11/25/2015   RDW 13.4 11/25/2015   PLT 126 (L) 11/25/2015   Last metabolic panel Lab Results  Component Value Date   GLUCOSE 91 11/24/2015   NA 136 11/24/2015   K 3.5 11/24/2015   CL 107 11/24/2015   CO2 23 11/24/2015   BUN 7 11/24/2015   CREATININE 0.64 11/24/2015   GFRNONAA >60 11/24/2015   GFRAA >60 11/24/2015   CALCIUM 8.6 (L) 11/24/2015   PROT 6.7 11/24/2015   ALBUMIN 3.5 11/24/2015   BILITOT 0.9 11/24/2015   ALKPHOS 64 11/24/2015   AST 18 11/24/2015   ALT 10 (L) 11/24/2015  ANIONGAP 6 11/24/2015      Objective    BP 110/80 (BP Location: Right Arm, Patient Position: Sitting, Cuff Size: Normal)   Pulse 62   Temp 98.1 F (36.7 C) (Oral)   Resp 16   Ht 5\' 2"  (1.575 m)   Wt 130 lb (59 kg)   SpO2 99%   BMI 23.78 kg/m  BP Readings from Last 3 Encounters:  03/03/20 110/80  08/25/19 (!) 147/84  07/27/19 120/78   Wt Readings from Last 3 Encounters:  03/03/20 130 lb (59 kg)  08/25/19 129 lb (58.5 kg)  10/16/17 130 lb 9.6 oz (59.2 kg)      Physical  Exam Cardiovascular:     Rate and Rhythm: Normal rate and regular rhythm.     Heart sounds: Normal heart sounds.  Pulmonary:     Effort: Pulmonary effort is normal.     Breath sounds: Normal breath sounds.  Abdominal:     General: Abdomen is flat.     Palpations: Abdomen is soft.     Tenderness: There is abdominal tenderness in the right upper quadrant. There is right CVA tenderness.  Musculoskeletal:     Lumbar back: Tenderness present.  Neurological:     General: No focal deficit present.     Mental Status: She is alert and oriented to person, place, and time.  Psychiatric:        Mood and Affect: Mood normal.        Behavior: Behavior normal.      No results found for any visits on 03/03/20.  Assessment & Plan     1. Acute flank pain 2. Muscle spasm of back - recurrent problem - h/o punctate renal calculus in 08/2019  - no urinary symptoms, but will recheck KUB - muscle spasm of R paraspinal muscles and obliques - suspect this is the cause of pain - avoid situps and twisting for now - advised heat, massage - flexeril prn - return precautions discussed  - POCT urinalysis dipstick - cyclobenzaprine (FLEXERIL) 10 MG tablet; Take 1 tablet (10 mg total) by mouth 3 (three) times daily as needed for muscle spasms.  Dispense: 30 tablet; Refill: 0 - DG Abd 1 View   Return if symptoms worsen or fail to improve.      I, 09/2019, MD, have reviewed all documentation for this visit. The documentation on 03/03/20 for the exam, diagnosis, procedures, and orders are all accurate and complete.   Pallavi Clifton, 03/05/20, MD, MPH [facility]

## 2020-03-07 ENCOUNTER — Telehealth: Payer: Self-pay

## 2020-03-07 NOTE — Telephone Encounter (Signed)
-----   Message from Erasmo Downer, MD sent at 03/06/2020  8:09 AM EDT ----- Very small kidney stone on the right.  Large stool burden, possibly suggesting constipation.  Constipation can cause some of the discomfort she has been feeling as well.  Unlikely that this very small kidney stone is causing her pain.

## 2020-03-07 NOTE — Telephone Encounter (Signed)
Written by Erasmo Downer, MD on 03/06/2020 8:09 AM EDT Seen by patient Desiree [name] on 03/06/2020 9:13 AM

## 2020-03-28 DIAGNOSIS — Z20822 Contact with and (suspected) exposure to covid-19: Secondary | ICD-10-CM | POA: Diagnosis not present

## 2020-03-29 DIAGNOSIS — R05 Cough: Secondary | ICD-10-CM | POA: Diagnosis not present

## 2020-03-29 DIAGNOSIS — J302 Other seasonal allergic rhinitis: Secondary | ICD-10-CM | POA: Diagnosis not present

## 2020-04-05 ENCOUNTER — Other Ambulatory Visit: Payer: Self-pay | Admitting: Family Medicine

## 2020-05-01 ENCOUNTER — Encounter: Payer: Self-pay | Admitting: Family Medicine

## 2020-07-09 ENCOUNTER — Other Ambulatory Visit: Payer: Self-pay | Admitting: Family Medicine

## 2020-07-10 ENCOUNTER — Ambulatory Visit: Payer: BC Managed Care – PPO | Admitting: Family Medicine

## 2020-07-10 NOTE — Progress Notes (Deleted)
Acute Office Visit  Subjective:    Patient ID: Desiree [name], female    DOB: 11-23-1975, 44 y.o.   MRN: [id number]  No chief complaint on file.   HPI Patient is in today for evaluation of pain under ribs.  Past Medical History:  Diagnosis Date  . Anxiety   . Depression   . Hypertension     Past Surgical History:  Procedure Laterality Date  . PERIPHERAL VASCULAR CATHETERIZATION Left 11/24/2015   Procedure: Upper Extremity Venography with possible intervention and possible thrombolysis;  Surgeon: Renford Dills, MD;  Location: [facility] IN[facility]ASIVE [facility];  Service: Cardiovascular;  Laterality: Left;  . TUBAL LIGATION  07/2010   WITH ABLATION  . uterine ablation      Family History  Problem Relation Age of Onset  . Diabetes Mother   . Coronary artery disease Mother   . Heart disease Mother   . Asthma Father     Social History   Socioeconomic History  . Marital status: Single    Spouse name: Not on file  . Number of children: Not on file  . Years of education: Not on file  . Highest education level: Not on file  Occupational History  . Not on file  Tobacco Use  . Smoking status: Former Smoker    Packs/day: 0.50    Years: 20.00    Pack years: 10.00    Types: Cigarettes    Quit date: 05/14/2016    Years since quitting: 4.1  . Smokeless tobacco: Never Used  Substance and Sexual Activity  . Alcohol use: No    Alcohol/week: 0.0 standard drinks    Comment: OCCASIONALLY  . Drug use: No  . Sexual activity: Not on file  Other Topics Concern  . Not on file  Social History Narrative  . Not on file   Social Determinants of Health   Financial Resource Strain: Not on file  Food Insecurity: Not on file  Transportation Needs: Not on file  Physical Activity: Not on file  Stress: Not on file  Social Connections: Not on file  Intimate Partner Violence: Not on file    Outpatient Medications Prior to Visit  Medication Sig Dispense Refill  . cyclobenzaprine  (FLEXERIL) 10 MG tablet Take 1 tablet (10 mg total) by mouth 3 (three) times daily as needed for muscle spasms. 30 tablet 0  . hydrochlorothiazide (HYDRODIURIL) 25 MG tablet TAKE 1 TABLET BY MOUTH EVERY DAY 30 tablet 0  . tamsulosin (FLOMAX) 0.4 MG CAPS capsule TAKE 1 CAPSULE BY MOUTH EVERY DAY (Patient not taking: Reported on 03/03/2020) 15 capsule 0   No facility-administered medications prior to visit.    Allergies  Allergen Reactions  . Venlafaxine     Increases Blood Pressure    Review of Systems     Objective:    Physical Exam  There were no vitals taken for this visit. Wt Readings from Last 3 Encounters:  03/03/20 130 lb (59 kg)  08/25/19 129 lb (58.5 kg)  10/16/17 130 lb 9.6 oz (59.2 kg)    Health Maintenance Due  Topic Date Due  . Hepatitis C Screening  Never done  . COVID-19 Vaccine (1) Never done  . HIV Screening  Never done  . PAP SMEAR-Modifier  Never done  . INFLUENZA VACCINE  Never done    There are no preventive care reminders to display for this patient.   Lab Results  Component Value Date   TSH 1.24 06/04/2012   Lab  Results  Component Value Date   WBC 6.0 11/25/2015   HGB 11.3 (L) 11/25/2015   HCT 33.1 (L) 11/25/2015   MCV 91.5 11/25/2015   PLT 126 (L) 11/25/2015   Lab Results  Component Value Date   NA 136 11/24/2015   K 3.5 11/24/2015   CO2 23 11/24/2015   GLUCOSE 91 11/24/2015   BUN 7 11/24/2015   CREATININE 0.64 11/24/2015   BILITOT 0.9 11/24/2015   ALKPHOS 64 11/24/2015   AST 18 11/24/2015   ALT 10 (L) 11/24/2015   PROT 6.7 11/24/2015   ALBUMIN 3.5 11/24/2015   CALCIUM 8.6 (L) 11/24/2015   ANIONGAP 6 11/24/2015   No results found for: CHOL No results found for: HDL No results found for: LDLCALC No results found for: TRIG No results found for: CHOLHDL No results found for: VCBS4H     Assessment & Plan:   Problem List Items Addressed This Visit   None      No orders of the defined types were placed in this  encounter.    Adline Peals, CMA

## 2020-07-11 ENCOUNTER — Other Ambulatory Visit: Payer: Self-pay | Admitting: Family Medicine

## 2020-07-21 ENCOUNTER — Other Ambulatory Visit: Payer: Self-pay

## 2020-07-21 ENCOUNTER — Ambulatory Visit (INDEPENDENT_AMBULATORY_CARE_PROVIDER_SITE_OTHER): Payer: BC Managed Care – PPO | Admitting: Family Medicine

## 2020-07-21 ENCOUNTER — Encounter: Payer: Self-pay | Admitting: Family Medicine

## 2020-07-21 VITALS — BP 116/70 | HR 83 | Temp 98.3°F | Resp 16 | Wt 126.8 lb

## 2020-07-21 DIAGNOSIS — R1011 Right upper quadrant pain: Secondary | ICD-10-CM | POA: Diagnosis not present

## 2020-07-21 DIAGNOSIS — R109 Unspecified abdominal pain: Secondary | ICD-10-CM

## 2020-07-21 DIAGNOSIS — G8929 Other chronic pain: Secondary | ICD-10-CM

## 2020-07-21 LAB — POCT URINALYSIS DIPSTICK
Bilirubin, UA: NEGATIVE
Blood, UA: NEGATIVE
Glucose, UA: NEGATIVE
Ketones, UA: NEGATIVE
Leukocytes, UA: NEGATIVE
Nitrite, UA: NEGATIVE
Protein, UA: NEGATIVE
Spec Grav, UA: 1.01 (ref 1.010–1.025)
Urobilinogen, UA: 0.2 E.U./dL
pH, UA: 7 (ref 5.0–8.0)

## 2020-07-21 NOTE — Progress Notes (Signed)
Established patient visit   Patient: Desiree [name]   DOB: Nov 15, 1975   45 y.o. Female  MRN: [id number] Visit Date: 07/21/2020  Today's healthcare provider: Vernie Murders, PA-C   Chief Complaint  Patient presents with  . Flank Pain   Subjective    HPI  Follow up for persistent right flank pain   The patient was last seen for this 5 months ago. Patient reports pain has been on going for about 1 1/2 year. Patient reports pain is constant and worse at time. Patient denies any activity or foods that make pain worse. Patient denies any fever,chills, nausea, vomiting, constipation or rash. Patient reports using heating pad to help with pain, reports mild symptom control.  Changes made at last visit include abdomen x-ray. Start cyclobenzaprine and Tamsulosin.  She reports excellent compliance with treatment. Patient reports she took all of the Tamsulosin and is still taking cyclobenzaprine as needed, reports mild symptom control.  She feels that condition is Unchanged. She is not having side effects.   -----------------------------------------------------------------------------------------  Patient Active Problem List   Diagnosis Date Noted  . Hx of thoracic outlet syndrome 09/19/2017  . DVT of upper extremity (deep vein thrombosis), left 11/30/2015  . Atypical squamous cells of undetermined significance (ASC-US) on cervical Pap smear 12/28/2014  . Affective bipolar disorder ([place]) 12/28/2014  . H/O renal calculi 12/28/2014  . Essential (primary) hypertension 12/28/2014   Social History   Tobacco Use  . Smoking status: Former Smoker    Packs/day: 0.50    Years: 20.00    Pack years: 10.00    Types: Cigarettes    Quit date: 05/14/2016    Years since quitting: 4.1  . Smokeless tobacco: Never Used  Substance Use Topics  . Alcohol use: No    Alcohol/week: 0.0 standard drinks    Comment: OCCASIONALLY  . Drug use: No   Allergies  Allergen Reactions  . Venlafaxine      Increases Blood Pressure  . Wound Dressing Adhesive     Other reaction(s): Other Skin blistering      Family History  Problem Relation Age of Onset  . Diabetes Mother   . Coronary artery disease Mother   . Heart disease Mother   . Asthma Father    Past Surgical History:  Procedure Laterality Date  . PERIPHERAL VASCULAR CATHETERIZATION Left 11/24/2015   Procedure: Upper Extremity Venography with possible intervention and possible thrombolysis;  Surgeon: Katha Cabal, MD;  Location: [facility];  Service: Cardiovascular;  Laterality: Left;  . TUBAL LIGATION  07/2010   WITH ABLATION  . uterine ablation       Medications: Outpatient Medications Prior to Visit  Medication Sig  . cyclobenzaprine (FLEXERIL) 10 MG tablet Take 1 tablet (10 mg total) by mouth 3 (three) times daily as needed for muscle spasms.  . hydrochlorothiazide (HYDRODIURIL) 25 MG tablet TAKE 1 TABLET BY MOUTH EVERY DAY  . tamsulosin (FLOMAX) 0.4 MG CAPS capsule TAKE 1 CAPSULE BY MOUTH EVERY DAY (Patient not taking: No sig reported)   No facility-administered medications prior to visit.    Review of Systems  Constitutional: Negative for chills and fever.  Respiratory: Negative for cough, chest tightness and shortness of breath.   Cardiovascular: Negative for chest pain and palpitations.  Gastrointestinal: Negative for abdominal distention, abdominal pain, constipation, nausea and vomiting.  Genitourinary: Positive for flank pain. Negative for dysuria.  Musculoskeletal: Positive for myalgias.  Skin: Negative for color change and rash.  Last CBC Lab Results  Component Value Date   WBC 6.0 11/25/2015   HGB 11.3 (L) 11/25/2015   HCT 33.1 (L) 11/25/2015   MCV 91.5 11/25/2015   MCH 31.4 11/25/2015   RDW 13.4 11/25/2015   PLT 126 (L) 11/25/2015   Last metabolic panel Lab Results  Component Value Date   GLUCOSE 91 11/24/2015   NA 136 11/24/2015   K 3.5 11/24/2015   CL 107 11/24/2015   CO2  23 11/24/2015   BUN 7 11/24/2015   CREATININE 0.64 11/24/2015   GFRNONAA >60 11/24/2015   GFRAA >60 11/24/2015   CALCIUM 8.6 (L) 11/24/2015   PROT 6.7 11/24/2015   ALBUMIN 3.5 11/24/2015   BILITOT 0.9 11/24/2015   ALKPHOS 64 11/24/2015   AST 18 11/24/2015   ALT 10 (L) 11/24/2015   ANIONGAP 6 11/24/2015      Objective    BP 116/70 (BP Location: Right Arm, Patient Position: Sitting, Cuff Size: Normal)   Pulse 83   Temp 98.3 F (36.8 C) (Oral)   Resp 16   Wt 126 lb 12.8 oz (57.5 kg)   SpO2 99%   BMI 23.19 kg/m  BP Readings from Last 3 Encounters:  07/21/20 116/70  03/03/20 110/80  08/25/19 (!) 147/84   Wt Readings from Last 3 Encounters:  07/21/20 126 lb 12.8 oz (57.5 kg)  03/03/20 130 lb (59 kg)  08/25/19 129 lb (58.5 kg)    Physical Exam Constitutional:      General: She is not in acute distress.    Appearance: She is well-developed and well-nourished.  HENT:     Head: Normocephalic and atraumatic.     Right Ear: Hearing normal.     Left Ear: Hearing normal.     Nose: Nose normal.  Eyes:     General: Lids are normal. No scleral icterus.       Right eye: No discharge.        Left eye: No discharge.     Conjunctiva/sclera: Conjunctivae normal.  Cardiovascular:     Rate and Rhythm: Regular rhythm.     Heart sounds: Normal heart sounds.  Pulmonary:     Effort: Pulmonary effort is normal. No respiratory distress.  Abdominal:     General: Bowel sounds are normal.     Palpations: Abdomen is soft.     Comments: Tender right lower ribs and flank to palpation. No mass palpable.  Musculoskeletal:        General: Normal range of motion.  Skin:    General: Skin is intact.     Findings: No lesion or rash.  Neurological:     Mental Status: She is alert and oriented to person, place, and time.  Psychiatric:        Mood and Affect: Mood and affect normal.        Speech: Speech normal.        Behavior: Behavior normal.        Thought Content: Thought content  normal.       No results found for any visits on 07/21/20.  Assessment & Plan     1. Right flank pain, chronic Recurrence of right flank/rib pain over the past couple weeks. Has had some kidney stones on past abdominal x-rays. No complete clearing of pain with Tamsulosin and Cyclobenzaprine (for musculoskelatal injury or spasm). No vaginal discharge, nausea, vomiting, diarrhea or constipation. This pain has been recurring over the past 1-1.5 years without any specific injury. No association with food  or activities. Some increase in discomfort when sitting up from reclined position. May apply moist heat and increase water intake. Will get CT to rule out renal stones. History of BTL and no menses in the past year since ablation in 2012. - CBC with Differential/Platelet - Comprehensive metabolic panel - POCT urinalysis dipstick  2. Right upper quadrant abdominal pain Right upper abdomen and rib pain intermittently for the past month. History of renal stones but no dysuria or hematuria today. Schedule CT scan of abdomen/pelvis. - CT Abdomen Pelvis Wo Contrast   No follow-ups on file.      I, Issam Carlyon, PA-C, have reviewed all documentation for this visit. The documentation on 07/21/20 for the exam, diagnosis, procedures, and orders are all accurate and complete.    Dortha Kern, PA-C  [facility] [employer] [phone] (phone) [phone] (fax)  [facility]

## 2020-07-22 LAB — CBC WITH DIFFERENTIAL/PLATELET
Basophils Absolute: 0 10*3/uL (ref 0.0–0.2)
Basos: 0 %
EOS (ABSOLUTE): 0 10*3/uL (ref 0.0–0.4)
Eos: 1 %
Hematocrit: 41 % (ref 34.0–46.6)
Hemoglobin: 13.6 g/dL (ref 11.1–15.9)
Immature Grans (Abs): 0 10*3/uL (ref 0.0–0.1)
Immature Granulocytes: 0 %
Lymphocytes Absolute: 1.7 10*3/uL (ref 0.7–3.1)
Lymphs: 24 %
MCH: 31.3 pg (ref 26.6–33.0)
MCHC: 33.2 g/dL (ref 31.5–35.7)
MCV: 94 fL (ref 79–97)
Monocytes Absolute: 0.6 10*3/uL (ref 0.1–0.9)
Monocytes: 8 %
Neutrophils Absolute: 4.7 10*3/uL (ref 1.4–7.0)
Neutrophils: 67 %
Platelets: 192 10*3/uL (ref 150–450)
RBC: 4.35 x10E6/uL (ref 3.77–5.28)
RDW: 12.4 % (ref 11.7–15.4)
WBC: 7.1 10*3/uL (ref 3.4–10.8)

## 2020-07-22 LAB — COMPREHENSIVE METABOLIC PANEL
ALT: 11 IU/L (ref 0–32)
AST: 21 IU/L (ref 0–40)
Albumin/Globulin Ratio: 1.7 (ref 1.2–2.2)
Albumin: 4.5 g/dL (ref 3.8–4.8)
Alkaline Phosphatase: 50 IU/L (ref 44–121)
BUN/Creatinine Ratio: 10 (ref 9–23)
BUN: 10 mg/dL (ref 6–24)
Bilirubin Total: 0.7 mg/dL (ref 0.0–1.2)
CO2: 22 mmol/L (ref 20–29)
Calcium: 9.5 mg/dL (ref 8.7–10.2)
Chloride: 102 mmol/L (ref 96–106)
Creatinine, Ser: 0.98 mg/dL (ref 0.57–1.00)
GFR calc Af Amer: 81 mL/min/{1.73_m2} (ref >59)
GFR calc non Af Amer: 70 mL/min/{1.73_m2} (ref >59)
Globulin, Total: 2.6 g/dL (ref 1.5–4.5)
Glucose: 93 mg/dL (ref 65–99)
Potassium: 4.4 mmol/L (ref 3.5–5.2)
Sodium: 139 mmol/L (ref 134–144)
Total Protein: 7.1 g/dL (ref 6.0–8.5)

## 2020-08-02 ENCOUNTER — Other Ambulatory Visit: Payer: Self-pay | Admitting: Family Medicine

## 2020-08-02 NOTE — Telephone Encounter (Signed)
Requested medications are due for refill today yes  Requested medications are on the active medication list yes  Last refill 12/26  Last visit 07/2019  Future visit scheduled no  Notes to clinic Has already had a curtesy refill and there is no upcoming appointment scheduled.

## 2020-08-07 ENCOUNTER — Ambulatory Visit
Admission: RE | Admit: 2020-08-07 | Discharge: 2020-08-07 | Disposition: A | Discharge status: Home / Self Care | Payer: BC Managed Care – PPO | Source: Ambulatory Visit | Attending: Family Medicine | Admitting: Family Medicine

## 2020-08-07 ENCOUNTER — Other Ambulatory Visit: Payer: Self-pay

## 2020-08-07 DIAGNOSIS — I700 Atherosclerosis of aorta: Secondary | ICD-10-CM | POA: Diagnosis not present

## 2020-08-07 DIAGNOSIS — N200 Calculus of kidney: Secondary | ICD-10-CM | POA: Diagnosis not present

## 2020-08-07 DIAGNOSIS — R1011 Right upper quadrant pain: Secondary | ICD-10-CM | POA: Insufficient documentation

## 2020-08-08 ENCOUNTER — Telehealth: Payer: Self-pay

## 2020-08-08 ENCOUNTER — Other Ambulatory Visit: Payer: Self-pay

## 2020-08-08 ENCOUNTER — Other Ambulatory Visit: Payer: Self-pay | Admitting: Family Medicine

## 2020-08-08 DIAGNOSIS — R1011 Right upper quadrant pain: Secondary | ICD-10-CM

## 2020-08-08 DIAGNOSIS — Z87442 Personal history of urinary calculi: Secondary | ICD-10-CM

## 2020-08-08 DIAGNOSIS — R109 Unspecified abdominal pain: Secondary | ICD-10-CM

## 2020-08-08 MED ORDER — TAMSULOSIN HCL 0.4 MG PO CAPS: 0.4000 mg | ORAL_CAPSULE | Freq: Every day | ORAL | 0 refills | Status: AC

## 2020-08-08 MED ORDER — HYDROCHLOROTHIAZIDE 25 MG PO TABS
25.0000 mg | ORAL_TABLET | Freq: Every day | ORAL | 0 refills | Status: DC
Start: 2020-08-08 — End: 2020-09-05

## 2020-08-08 NOTE — Progress Notes (Signed)
Please make sure that urology referral was placed by [facility] or [facility]. If not, ok to place and I will cosign. Thanks!

## 2020-08-08 NOTE — Telephone Encounter (Signed)
Placed order for medication refill and urology referral today.

## 2020-08-08 NOTE — Telephone Encounter (Signed)
-----   Message from Erasmo Downer, MD sent at 08/08/2020  2:01 PM EST ----- Please make sure that urology referral was placed by [facility] or [facility]. If not, ok to place and I will cosign. Thanks!

## 2020-08-08 NOTE — Telephone Encounter (Signed)
Pt is in pain and is frustrated and was emotional during call- pt wants a referral to the urologist. She also wants a refill on HCTZ. And tamulosin. Please MyChart msg response.

## 2020-08-08 NOTE — Progress Notes (Signed)
Thanks for getting the referral arranged.

## 2020-08-24 ENCOUNTER — Other Ambulatory Visit: Payer: Self-pay

## 2020-08-24 ENCOUNTER — Ambulatory Visit (INDEPENDENT_AMBULATORY_CARE_PROVIDER_SITE_OTHER): Payer: BC Managed Care – PPO | Admitting: Urology

## 2020-08-24 ENCOUNTER — Encounter: Payer: Self-pay | Admitting: Urology

## 2020-08-24 VITALS — BP 156/84 | HR 58 | Ht 62.0 in | Wt 125.0 lb

## 2020-08-24 DIAGNOSIS — R1011 Right upper quadrant pain: Secondary | ICD-10-CM

## 2020-08-24 DIAGNOSIS — N200 Calculus of kidney: Secondary | ICD-10-CM

## 2020-08-24 LAB — MICROSCOPIC EXAMINATION
RBC, Urine: NONE SEEN /hpf (ref 0–2)
WBC, UA: NONE SEEN /hpf (ref 0–5)

## 2020-08-24 LAB — URINALYSIS, COMPLETE
Bilirubin, UA: NEGATIVE
Glucose, UA: NEGATIVE
Ketones, UA: NEGATIVE
Leukocytes,UA: NEGATIVE
Nitrite, UA: NEGATIVE
Protein,UA: NEGATIVE
RBC, UA: NEGATIVE
Specific Gravity, UA: 1.01 (ref 1.005–1.030)
Urobilinogen, Ur: 0.2 mg/dL (ref 0.2–1.0)
pH, UA: 7 (ref 5.0–7.5)

## 2020-08-24 NOTE — Progress Notes (Signed)
08/24/2020 2:31 PM   Desiree [name] [birth year] [id number]  Referring provider: Erasmo Downer, MD [id number] [place],  [place] [address]  Chief Complaint  Patient presents with  . Other    HPI: Desiree [name] is a 45 y.o. female referred for evaluation of nephrolithiasis.   Initially seen by PCP February 2021 with onset of a stabbing pain on the right side around the costal margin at the anterior axillary line  Urinalysis was unremarkable and she was treated with NSAID, hydrocodone and tamsulosin for a possible stone  KUB showed a questionable small stone overlying the right renal outline  Continue to have intermittent pain and was seen again on August 2021 complaining of right upper quadrant abdominal pain associated with nausea vomiting and a sensation of tightness around the ribs/costal margin  Dipstick urinalysis without blood  Given a trial of Flexeril for possible musculoskeletal pain  Repeat KUB with 3 mm calcification overlying the right renal outline  Continued with intermittent symptoms and CT performed 08/07/2020 showed a 3 mm nonobstructing right renal calculus.  No ureteral calculi or hydronephrosis  History of pyelonephritis while in college  Has passed 2-3 stones over the last several years   PMH: Past Medical History:  Diagnosis Date  . Anxiety   . Depression   . Hypertension     Surgical History: Past Surgical History:  Procedure Laterality Date  . PERIPHERAL VASCULAR CATHETERIZATION Left 11/24/2015   Procedure: Upper Extremity Venography with possible intervention and possible thrombolysis;  Surgeon: Renford Dills, MD;  Location: [facility] INVASIVE [facility] LAB;  Service: Cardiovascular;  Laterality: Left;  . TUBAL LIGATION  07/2010   WITH ABLATION  . uterine ablation      Home Medications:  Allergies as of 08/24/2020      Reactions   Venlafaxine    Increases Blood Pressure   Wound Dressing Adhesive    Other reaction(s):  Other Skin blistering      Medication List       Accurate as of August 24, 2020  2:31 PM. If you have any questions, ask your nurse or doctor.        cyclobenzaprine 10 MG tablet Commonly known as: FLEXERIL Take 1 tablet (10 mg total) by mouth 3 (three) times daily as needed for muscle spasms.   hydrochlorothiazide 25 MG tablet Commonly known as: HYDRODIURIL Take 1 tablet (25 mg total) by mouth daily.   tamsulosin 0.4 MG Caps capsule Commonly known as: FLOMAX Take 1 capsule (0.4 mg total) by mouth daily.       Allergies:  Allergies  Allergen Reactions  . Venlafaxine     Increases Blood Pressure  . Wound Dressing Adhesive     Other reaction(s): Other Skin blistering    Family History: Family History  Problem Relation Age of Onset  . Diabetes Mother   . Coronary artery disease Mother   . Heart disease Mother   . Asthma Father     Social History:  reports that she quit smoking about 4 years ago. Her smoking use included cigarettes. She has a 10.00 pack-year smoking history. She has never used smokeless tobacco. She reports that she does not drink alcohol and does not use drugs.   Physical Exam: BP (!) 156/84   Pulse (!) 58   Ht 5\' 2"  (1.575 m)   Wt 125 lb (56.7 kg)   BMI 22.86 kg/m   Constitutional:  Alert, No acute distress. HEENT: [place] AT, moist mucus membranes.  Trachea midline, no masses. Cardiovascular: No clubbing, cyanosis, or edema. Respiratory: Normal respiratory effort, no increased work of breathing. Psychiatric: Normal mood and affect.  Imaging: CT images personally reviewed and interpreted.  3 mm nonobstructing calculus posterior calyx right midpole.  No hydronephrosis or ureteral calculi  Assessment & Plan:    1.  Right nephrolithiasis  Discussed with Desiree [name] she has a small, nonobstructing calculus and it is unlikely a source of her right abdominal pain  Her presenting pain is atypical and urinalysis have shown no blood  We  discussed the possibility of gallbladder related pain and that CT does not always identify abnormalities of the gallbladder including sludge  Right upper quadrant ultrasound was ordered and she will be notified with results   Riki Altes, MD  [facility] [address], [address] [place], [place] [address] [phone]

## 2020-09-05 ENCOUNTER — Other Ambulatory Visit: Payer: Self-pay

## 2020-09-05 ENCOUNTER — Telehealth: Payer: Self-pay | Admitting: *Deleted

## 2020-09-05 ENCOUNTER — Other Ambulatory Visit: Payer: Self-pay | Admitting: Family Medicine

## 2020-09-05 ENCOUNTER — Ambulatory Visit
Admission: RE | Admit: 2020-09-05 | Discharge: 2020-09-05 | Disposition: A | Discharge status: Home / Self Care | Payer: BC Managed Care – PPO | Source: Ambulatory Visit | Attending: Urology | Admitting: Urology

## 2020-09-05 DIAGNOSIS — R1011 Right upper quadrant pain: Secondary | ICD-10-CM | POA: Diagnosis not present

## 2020-09-05 DIAGNOSIS — R109 Unspecified abdominal pain: Secondary | ICD-10-CM

## 2020-09-05 DIAGNOSIS — Z87442 Personal history of urinary calculi: Secondary | ICD-10-CM

## 2020-09-05 NOTE — Telephone Encounter (Signed)
Notified patient as instructed.   

## 2020-09-05 NOTE — Telephone Encounter (Signed)
-----   Message from Riki Altes, MD sent at 09/05/2020  3:22 PM EST ----- Right upper quadrant ultrasound showed no evidence of gallstones or sludge.  As we discussed at our office visit it is highly unlikely her small stone would be causing her pain.  Would recommend follow-up with her PCP for further evaluation

## 2020-09-07 DIAGNOSIS — M9902 Segmental and somatic dysfunction of thoracic region: Secondary | ICD-10-CM | POA: Diagnosis not present

## 2020-09-07 DIAGNOSIS — M5414 Radiculopathy, thoracic region: Secondary | ICD-10-CM | POA: Diagnosis not present

## 2020-09-07 DIAGNOSIS — M5413 Radiculopathy, cervicothoracic region: Secondary | ICD-10-CM | POA: Diagnosis not present

## 2020-09-07 DIAGNOSIS — M9901 Segmental and somatic dysfunction of cervical region: Secondary | ICD-10-CM | POA: Diagnosis not present

## 2020-09-11 DIAGNOSIS — M9902 Segmental and somatic dysfunction of thoracic region: Secondary | ICD-10-CM | POA: Diagnosis not present

## 2020-09-11 DIAGNOSIS — M5414 Radiculopathy, thoracic region: Secondary | ICD-10-CM | POA: Diagnosis not present

## 2020-09-11 DIAGNOSIS — M9901 Segmental and somatic dysfunction of cervical region: Secondary | ICD-10-CM | POA: Diagnosis not present

## 2020-09-11 DIAGNOSIS — M5413 Radiculopathy, cervicothoracic region: Secondary | ICD-10-CM | POA: Diagnosis not present

## 2020-09-12 DIAGNOSIS — M5413 Radiculopathy, cervicothoracic region: Secondary | ICD-10-CM | POA: Diagnosis not present

## 2020-09-12 DIAGNOSIS — M9902 Segmental and somatic dysfunction of thoracic region: Secondary | ICD-10-CM | POA: Diagnosis not present

## 2020-09-12 DIAGNOSIS — M5414 Radiculopathy, thoracic region: Secondary | ICD-10-CM | POA: Diagnosis not present

## 2020-09-12 DIAGNOSIS — M9901 Segmental and somatic dysfunction of cervical region: Secondary | ICD-10-CM | POA: Diagnosis not present

## 2020-09-26 DIAGNOSIS — M9901 Segmental and somatic dysfunction of cervical region: Secondary | ICD-10-CM | POA: Diagnosis not present

## 2020-09-26 DIAGNOSIS — M5413 Radiculopathy, cervicothoracic region: Secondary | ICD-10-CM | POA: Diagnosis not present

## 2020-09-26 DIAGNOSIS — M5414 Radiculopathy, thoracic region: Secondary | ICD-10-CM | POA: Diagnosis not present

## 2020-09-26 DIAGNOSIS — M9902 Segmental and somatic dysfunction of thoracic region: Secondary | ICD-10-CM | POA: Diagnosis not present

## 2020-10-03 ENCOUNTER — Other Ambulatory Visit: Payer: Self-pay | Admitting: Family Medicine

## 2020-10-03 DIAGNOSIS — Z87442 Personal history of urinary calculi: Secondary | ICD-10-CM

## 2020-10-03 DIAGNOSIS — R109 Unspecified abdominal pain: Secondary | ICD-10-CM

## 2020-10-25 ENCOUNTER — Other Ambulatory Visit: Payer: Self-pay | Admitting: Family Medicine

## 2020-10-25 DIAGNOSIS — R109 Unspecified abdominal pain: Secondary | ICD-10-CM

## 2020-10-25 DIAGNOSIS — Z87442 Personal history of urinary calculi: Secondary | ICD-10-CM

## 2020-11-27 ENCOUNTER — Other Ambulatory Visit: Payer: Self-pay | Admitting: Family Medicine

## 2020-11-27 DIAGNOSIS — Z87442 Personal history of urinary calculi: Secondary | ICD-10-CM

## 2020-11-27 DIAGNOSIS — R109 Unspecified abdominal pain: Secondary | ICD-10-CM

## 2021-01-03 ENCOUNTER — Other Ambulatory Visit: Payer: Self-pay | Admitting: Family Medicine

## 2021-01-03 DIAGNOSIS — R109 Unspecified abdominal pain: Secondary | ICD-10-CM

## 2021-01-03 DIAGNOSIS — Z87442 Personal history of urinary calculi: Secondary | ICD-10-CM

## 2021-01-10 DIAGNOSIS — B349 Viral infection, unspecified: Secondary | ICD-10-CM | POA: Diagnosis not present

## 2021-01-10 DIAGNOSIS — R509 Fever, unspecified: Secondary | ICD-10-CM | POA: Diagnosis not present

## 2021-02-03 ENCOUNTER — Other Ambulatory Visit: Payer: Self-pay | Admitting: Family Medicine

## 2021-02-03 DIAGNOSIS — R109 Unspecified abdominal pain: Secondary | ICD-10-CM

## 2021-02-03 DIAGNOSIS — Z87442 Personal history of urinary calculi: Secondary | ICD-10-CM

## 2021-02-03 NOTE — Telephone Encounter (Signed)
Requested medication (s) are due for refill today: yes  Requested medication (s) are on the active medication list: yes  Last refill:  01/03/21 #30 was a courtesy RF  Future visit scheduled: no  Notes to clinic:  called pt and LM on VM to call office to schedule appt call back number provided.   Requested Prescriptions  Pending Prescriptions Disp Refills   hydrochlorothiazide (HYDRODIURIL) 25 MG tablet [Pharmacy Med Name: HYDROCHLOROTHIAZIDE 25 MG TAB] 30 tablet 0    Sig: TAKE 1 TABLET (25 MG TOTAL) BY MOUTH DAILY.      Cardiovascular: Diuretics - Thiazide Failed - 02/03/2021  9:31 AM      Failed - Last BP in normal range    BP Readings from Last 1 Encounters:  08/24/20 (!) 156/84          Failed - Valid encounter within last 6 months    Recent Outpatient Visits           6 months ago Right flank pain, chronic   [facility], Jodell Cipro, PA-C   11 months ago Acute flank pain   [facility] [place], Marzella Schlein, MD   1 year ago Acute right flank pain   [facility], Marzella Schlein, MD   1 year ago Essential (primary) hypertension   [facility], Marzella Schlein, MD   2 years ago Essential (primary) hypertension   [facility], Marzella Schlein, MD                Passed - Ca in normal range and within 360 days    Calcium  Date Value Ref Range Status  07/21/2020 9.5 8.7 - 10.2 mg/dL Final   Calcium, Total  Date Value Ref Range Status  06/04/2012 9.2 8.5 - 10.1 mg/dL Final          Passed - Cr in normal range and within 360 days    Creatinine  Date Value Ref Range Status  06/04/2012 0.97 0.60 - 1.30 mg/dL Final   Creatinine, Ser  Date Value Ref Range Status  07/21/2020 0.98 0.57 - 1.00 mg/dL Final          Passed - K in normal range and within 360 days    Potassium  Date Value Ref Range Status  07/21/2020 4.4 3.5 - 5.2 mmol/L Final  06/04/2012 3.3 (L) 3.5 - 5.1  mmol/L Final          Passed - Na in normal range and within 360 days    Sodium  Date Value Ref Range Status  07/21/2020 139 134 - 144 mmol/L Final  06/04/2012 135 (L) 136 - 145 mmol/L Final

## 2021-03-06 ENCOUNTER — Ambulatory Visit: Payer: Self-pay | Admitting: *Deleted

## 2021-03-06 NOTE — Telephone Encounter (Signed)
Pt called stating that she is having pain in her Left arm again x3 weeks. She states that she has surgery on a DVT 5 years ago that fixed this issue. She is requesting to have a call back to discuss best options now that she has moved 3 hours away. Please advise.   Call to patient- patient states she has had numbness/tingling in her upper left arm for 3 weeks now. She is concerned due to her hx of DVT. Patient has moved to [facility] and is not local at this time. Patient advised ED per protocol- she will do follow up call to PCP for any referral needed after she is checked at ED. Reason for Disposition  [1] Age > 40 AND [2] no obvious cause AND [3] pain even when not moving the arm  (Exception: pain is clearly made worse by moving arm or bending neck)  Answer Assessment - Initial Assessment Questions 1. ONSET: "When did the pain start?"     3 weeks ago- comes and goes 2. LOCATION: "Where is the pain located?"     Left- bicep- numbness and tingling 3. PAIN: "How bad is the pain?" (Scale 1-10; or mild, moderate, severe)   - MILD (1-3): doesn't interfere with normal activities   - MODERATE (4-7): interferes with normal activities (e.g., work or school) or awakens from sleep   - SEVERE (8-10): excruciating pain, unable to do any normal activities, unable to hold a cup of water     Mild/moderate 4. WORK OR EXERCISE: "Has there been any recent work or exercise that involved this part of the body?"     Normal activity 5. CAUSE: "What do you think is causing the arm pain?"     Hx DVT- worrisome 6. OTHER SYMPTOMS: "Do you have any other symptoms?" (e.g., neck pain, swelling, rash, fever, numbness, weakness)     Some "weird feeling" at neck, numbness-tinging in shoulder to fingers 7. PREGNANCY: "Is there any chance you are pregnant?" "When was your last menstrual period?"     N/a  Protocols used: Arm Pain-A-AH

## 2021-03-07 DIAGNOSIS — M5412 Radiculopathy, cervical region: Secondary | ICD-10-CM | POA: Diagnosis not present

## 2021-03-07 DIAGNOSIS — R600 Localized edema: Secondary | ICD-10-CM | POA: Diagnosis not present

## 2021-03-07 DIAGNOSIS — Z86718 Personal history of other venous thrombosis and embolism: Secondary | ICD-10-CM | POA: Diagnosis not present

## 2021-03-07 DIAGNOSIS — R200 Anesthesia of skin: Secondary | ICD-10-CM | POA: Diagnosis not present

## 2021-03-08 ENCOUNTER — Other Ambulatory Visit: Payer: Self-pay | Admitting: Family Medicine

## 2021-03-08 DIAGNOSIS — R109 Unspecified abdominal pain: Secondary | ICD-10-CM

## 2021-03-08 DIAGNOSIS — Z87442 Personal history of urinary calculi: Secondary | ICD-10-CM

## 2021-03-08 NOTE — Telephone Encounter (Signed)
Requested medication (s) are due for refill today: yes  Requested medication (s) are on the active medication list: yes   Last refill:  02/05/2021  Future visit scheduled: no   Notes to clinic:  overdue for office visit Vm left for patient to callback    Requested Prescriptions  Pending Prescriptions Disp Refills   hydrochlorothiazide (HYDRODIURIL) 25 MG tablet [Pharmacy Med Name: HYDROCHLOROTHIAZIDE 25 MG TAB] 30 tablet 0    Sig: TAKE 1 TABLET (25 MG TOTAL) BY MOUTH DAILY.     Cardiovascular: Diuretics - Thiazide Failed - 03/08/2021  1:31 PM      Failed - Last BP in normal range    BP Readings from Last 1 Encounters:  08/24/20 (!) 156/84          Failed - Valid encounter within last 6 months    Recent Outpatient Visits           7 months ago Right flank pain, chronic   [facility], Jodell Cipro, PA-C   1 year ago Acute flank pain   [facility], Marzella Schlein, MD   1 year ago Acute right flank pain   [facility] [place], Marzella Schlein, MD   1 year ago Essential (primary) hypertension   [facility] [place], Marzella Schlein, MD   2 years ago Essential (primary) hypertension   [facility] [place], Marzella Schlein, MD              Passed - Ca in normal range and within 360 days    Calcium  Date Value Ref Range Status  07/21/2020 9.5 8.7 - 10.2 mg/dL Final   Calcium, Total  Date Value Ref Range Status  06/04/2012 9.2 8.5 - 10.1 mg/dL Final          Passed - Cr in normal range and within 360 days    Creatinine  Date Value Ref Range Status  06/04/2012 0.97 0.60 - 1.30 mg/dL Final   Creatinine, Ser  Date Value Ref Range Status  07/21/2020 0.98 0.57 - 1.00 mg/dL Final          Passed - K in normal range and within 360 days    Potassium  Date Value Ref Range Status  07/21/2020 4.4 3.5 - 5.2 mmol/L Final  06/04/2012 3.3 (L) 3.5 - 5.1 mmol/L Final          Passed - Na in normal  range and within 360 days    Sodium  Date Value Ref Range Status  07/21/2020 139 134 - 144 mmol/L Final  06/04/2012 135 (L) 136 - 145 mmol/L Final

## 2021-04-12 ENCOUNTER — Other Ambulatory Visit: Payer: Self-pay | Admitting: Family Medicine

## 2021-04-12 DIAGNOSIS — R109 Unspecified abdominal pain: Secondary | ICD-10-CM

## 2021-04-12 DIAGNOSIS — Z87442 Personal history of urinary calculi: Secondary | ICD-10-CM

## 2021-04-12 NOTE — Telephone Encounter (Signed)
Courtesy refill. Called patient to scheduled appt. [phone]. No answer, left message to call clinic back .

## 2021-04-26 ENCOUNTER — Other Ambulatory Visit: Payer: Self-pay | Admitting: Family Medicine

## 2021-04-26 DIAGNOSIS — R109 Unspecified abdominal pain: Secondary | ICD-10-CM

## 2021-04-26 DIAGNOSIS — Z87442 Personal history of urinary calculi: Secondary | ICD-10-CM

## 2021-04-27 NOTE — Telephone Encounter (Signed)
Requested Prescriptions  Pending Prescriptions Disp Refills  . hydrochlorothiazide (HYDRODIURIL) 25 MG tablet [Pharmacy Med Name: HYDROCHLOROTHIAZIDE 25 MG TAB] 90 tablet 1    Sig: TAKE 1 TABLET (25 MG TOTAL) BY MOUTH DAILY.     Cardiovascular: Diuretics - Thiazide Failed - 04/26/2021  8:45 AM      Failed - Last BP in normal range    BP Readings from Last 1 Encounters:  08/24/20 (!) 156/84         Failed - Valid encounter within last 6 months    Recent Outpatient Visits          9 months ago Right flank pain, chronic   [facility], Jodell Cipro, PA-C   1 year ago Acute flank pain   [facility] [place], Marzella Schlein, MD   1 year ago Acute right flank pain   [facility], Marzella Schlein, MD   1 year ago Essential (primary) hypertension   [facility] [place], Marzella Schlein, MD   2 years ago Essential (primary) hypertension   [facility] [place], Marzella Schlein, MD             Passed - Ca in normal range and within 360 days    Calcium  Date Value Ref Range Status  07/21/2020 9.5 8.7 - 10.2 mg/dL Final   Calcium, Total  Date Value Ref Range Status  06/04/2012 9.2 8.5 - 10.1 mg/dL Final         Passed - Cr in normal range and within 360 days    Creatinine  Date Value Ref Range Status  06/04/2012 0.97 0.60 - 1.30 mg/dL Final   Creatinine, Ser  Date Value Ref Range Status  07/21/2020 0.98 0.57 - 1.00 mg/dL Final         Passed - K in normal range and within 360 days    Potassium  Date Value Ref Range Status  07/21/2020 4.4 3.5 - 5.2 mmol/L Final  06/04/2012 3.3 (L) 3.5 - 5.1 mmol/L Final         Passed - Na in normal range and within 360 days    Sodium  Date Value Ref Range Status  07/21/2020 139 134 - 144 mmol/L Final  06/04/2012 135 (L) 136 - 145 mmol/L Final

## 2021-06-04 IMAGING — CR DG ABDOMEN 1V
1 series · 1 of 1 positions shown · non-contrast
Comparison: CT abdomen and pelvis 07/24/2012

CLINICAL DATA: Right flank pain and urinary frequency. History of
kidney stones.

EXAM:
ABDOMEN - 1 VIEW

[dg abd 1 view]
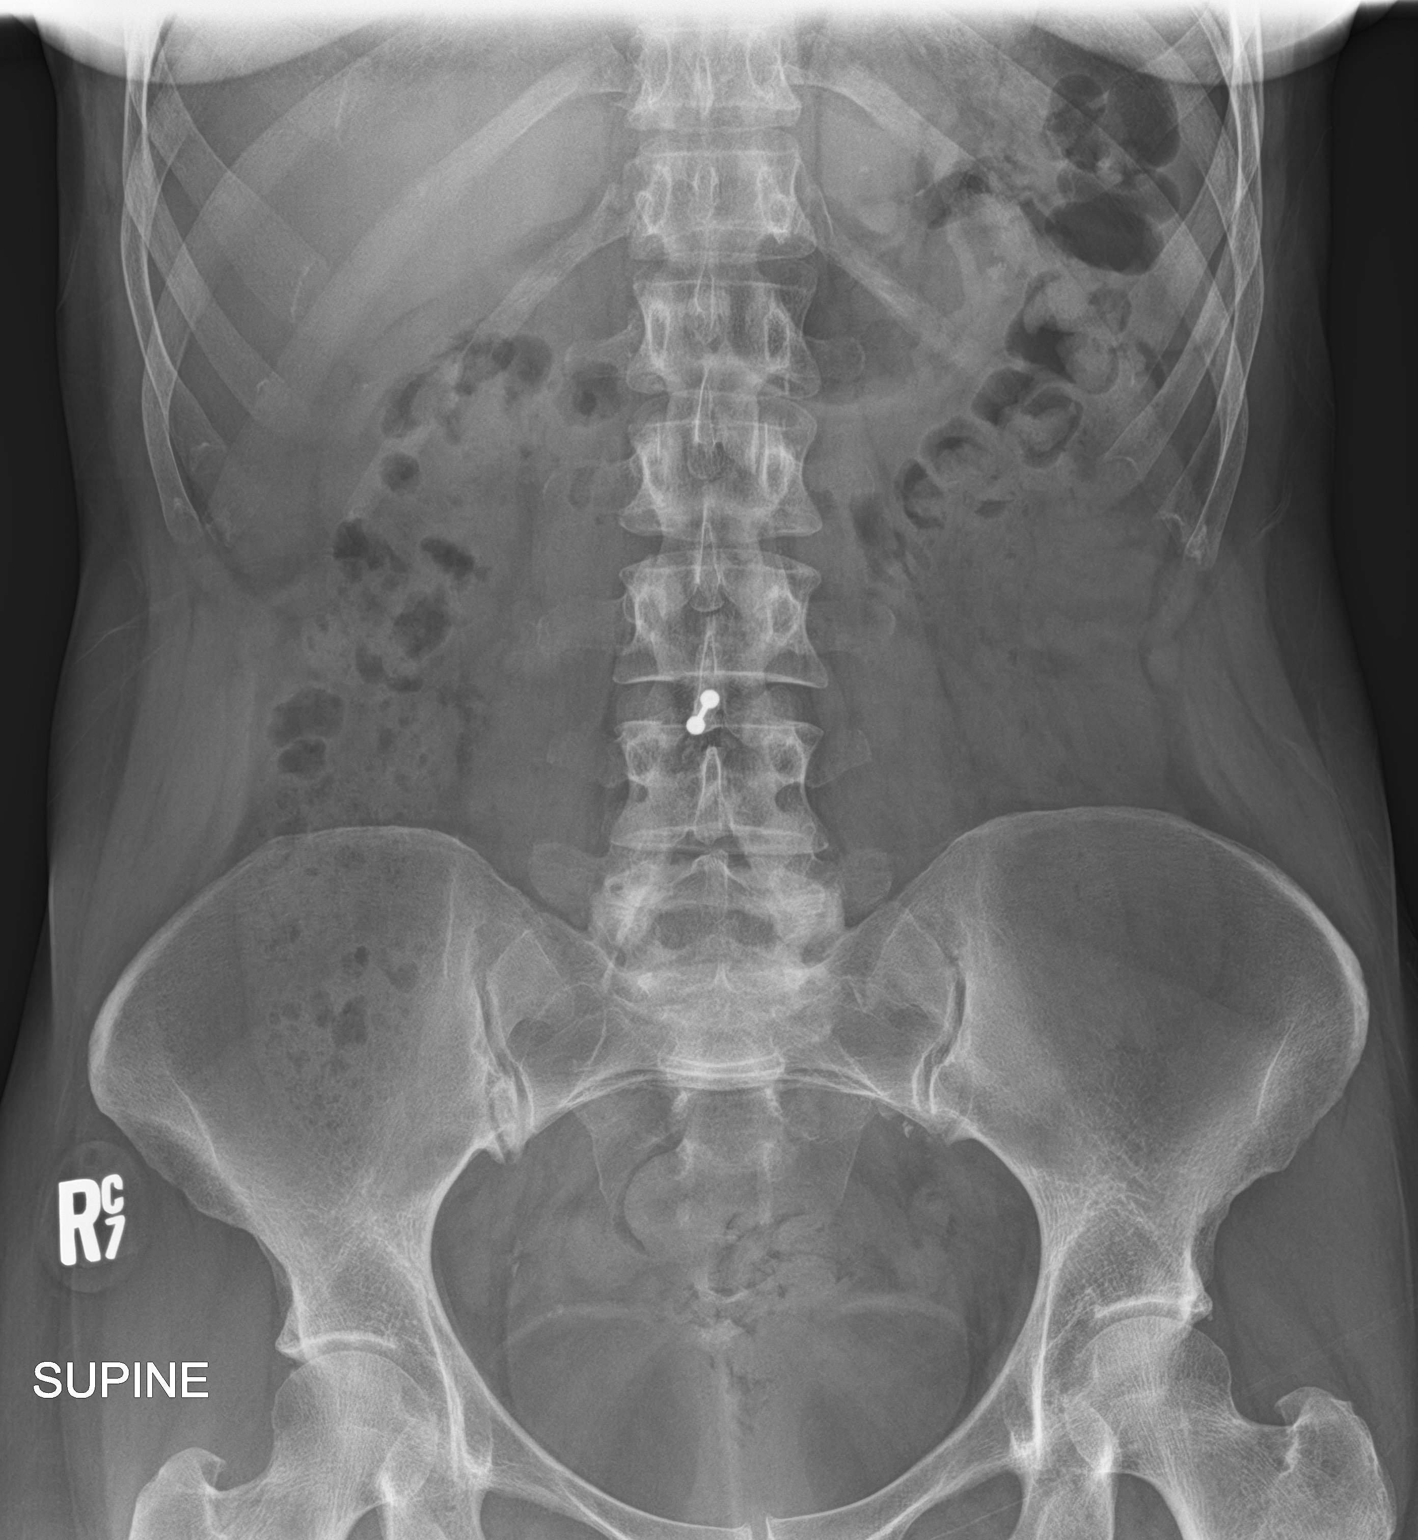

[1 of 1 positions shown; findings below may reference images not displayed]

FINDINGS: Assessment for renal calculi is mildly limited by overlying stool. A
punctate calculus is questioned laterally in the upper pole of the
right kidney. There are a few punctate calcifications in the pelvis
on the right which could reflect phleboliths or possibly distal
ureteral calculi. Only a single phlebolith was present in this
region on the 2672 CT. No dilated loops of bowel are seen to suggest
obstruction. No acute osseous abnormality is identified.
IMPRESSION: 1. Possible punctate right renal calculus.
2. Punctate calcifications in the right pelvis which could reflect
phleboliths although a distal ureteral calculus is also possible.

## 2022-06-16 IMAGING — US US ABDOMEN LIMITED RUQ/ASCITES
1 series · 14 of 25 positions shown · non-contrast
Comparison: 08/07/2020 and prior.

CLINICAL DATA: [DATE]-year history intermittent right upper quadrant
pain

EXAM:
ULTRASOUND ABDOMEN LIMITED RIGHT UPPER QUADRANT

[Series 1: us abdomen limited ruq/ascites · 0.17mm/px · 14 of 55 slices shown]
[im 1/55]
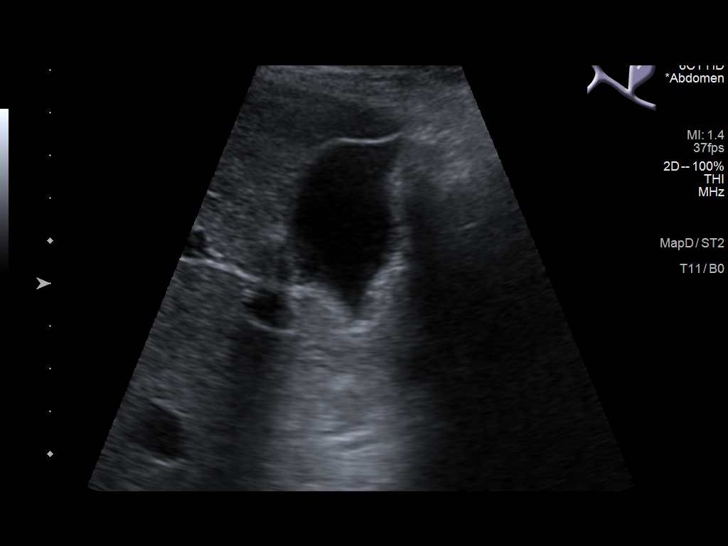
[im 5/55]
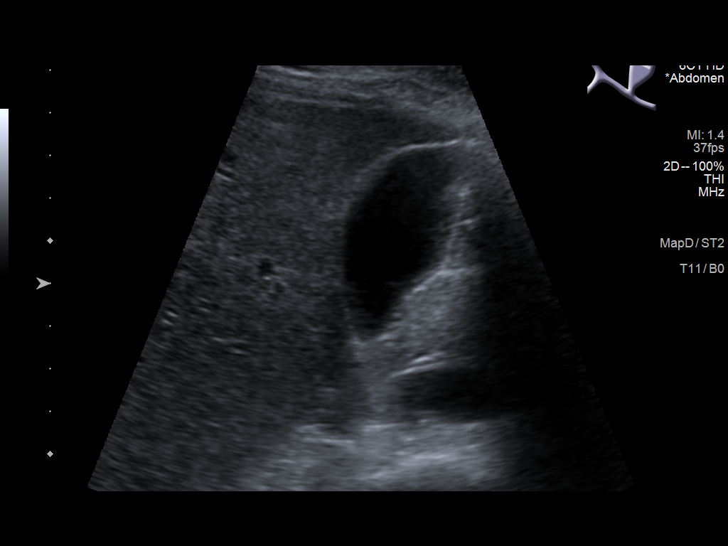
[im 10/55]
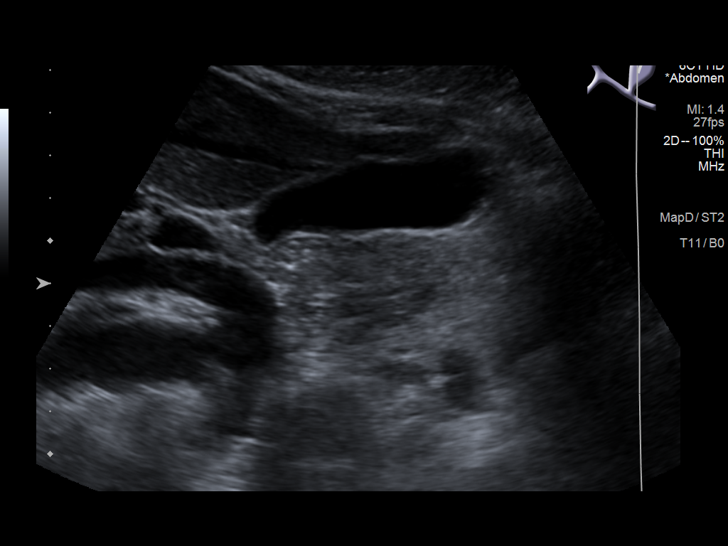
[im 14/55]
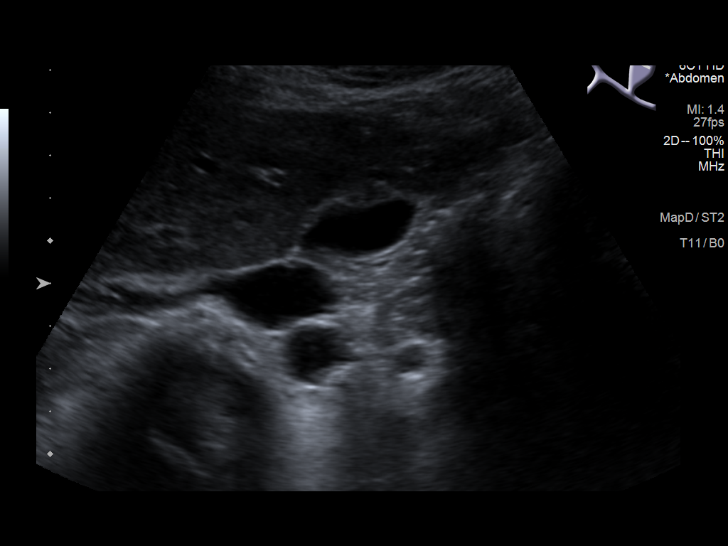
[im 19/55]
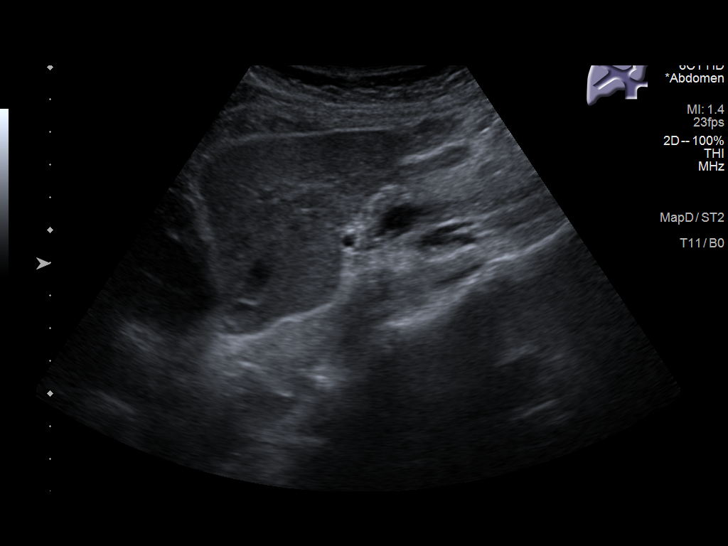
[im 21/55]
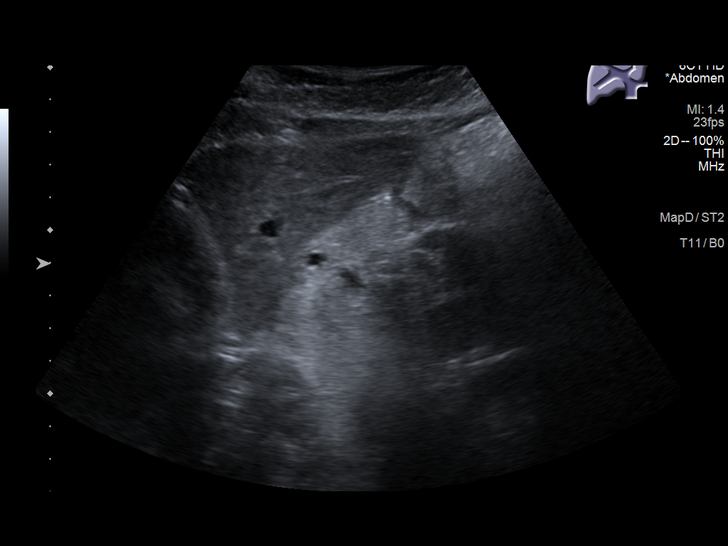
[im 25/55]
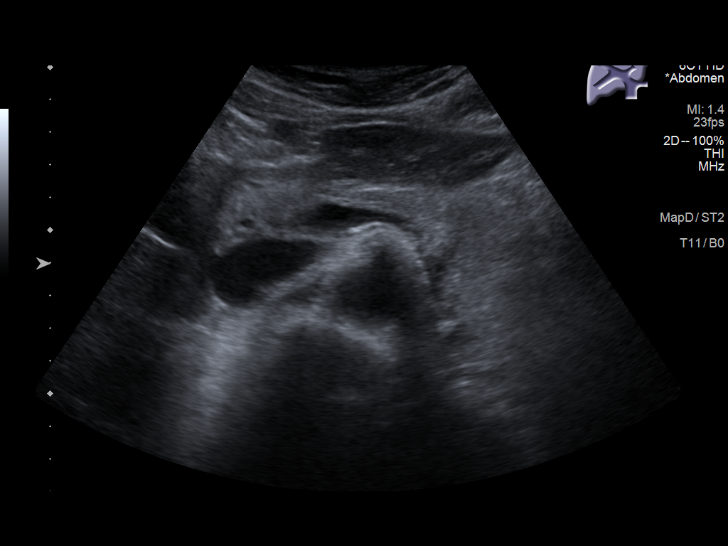
[im 30/55]
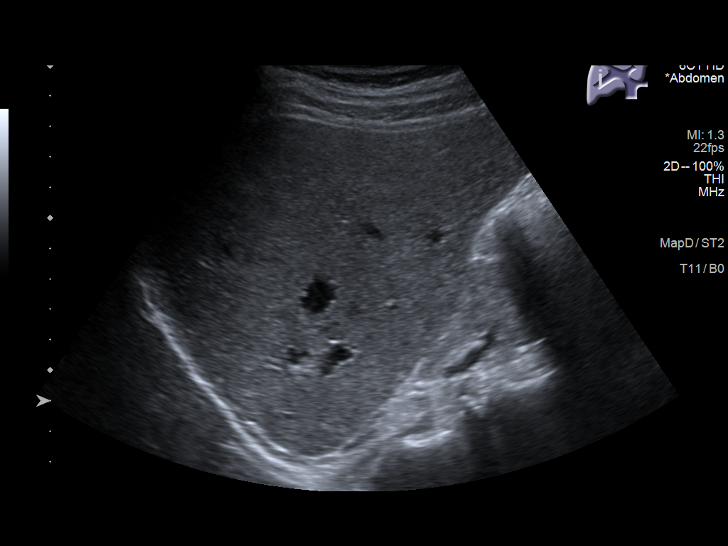
[im 34/55]
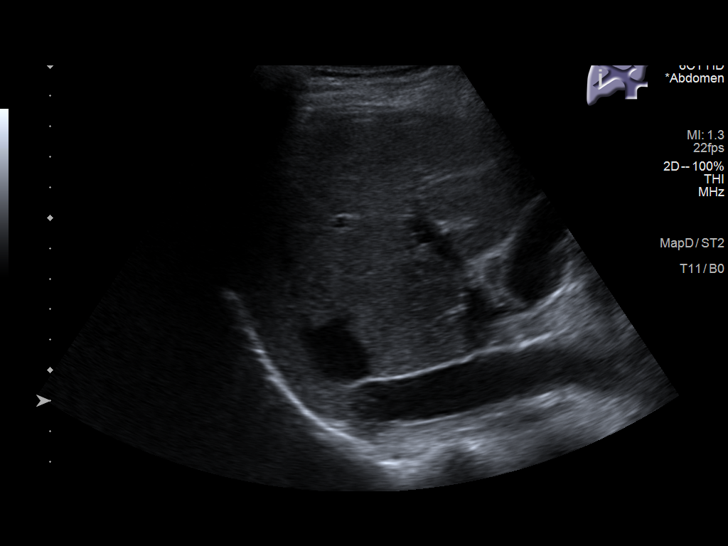
[im 37/55]
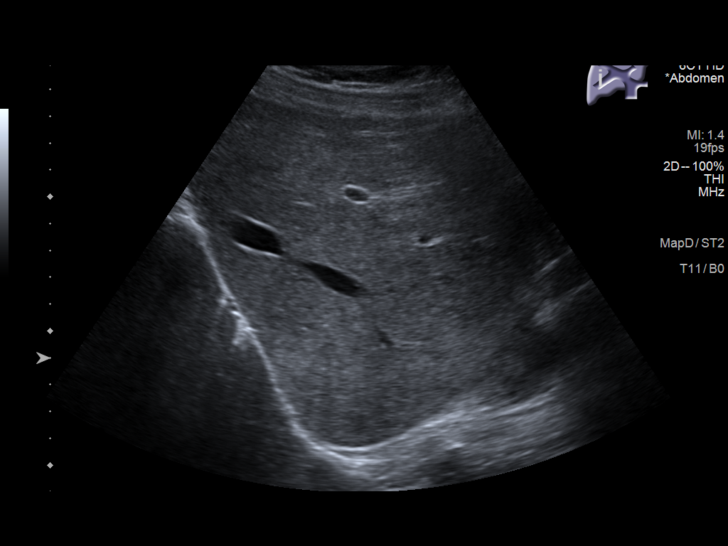
[im 41/55]
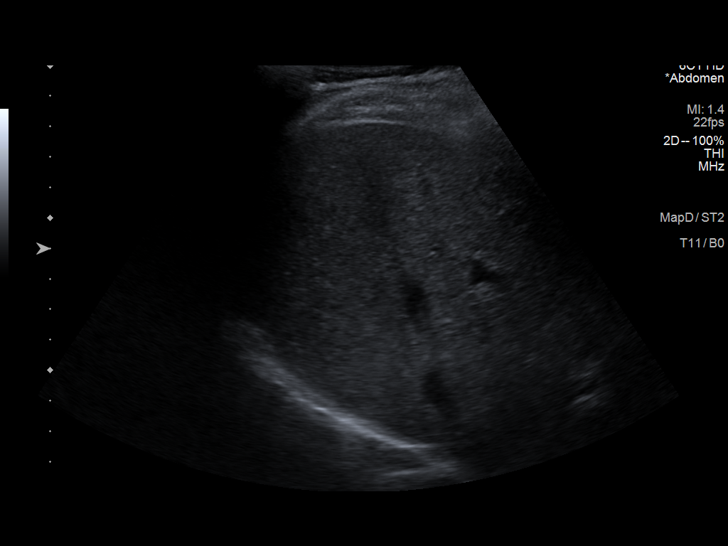
[im 46/55]
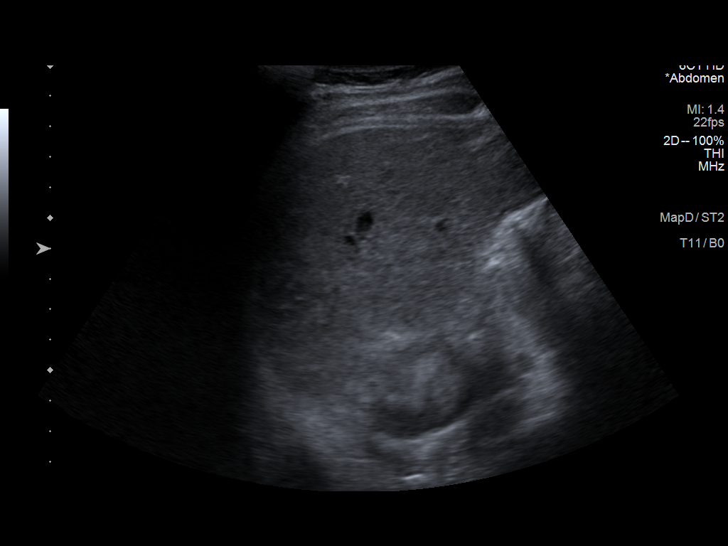
[im 50/55]
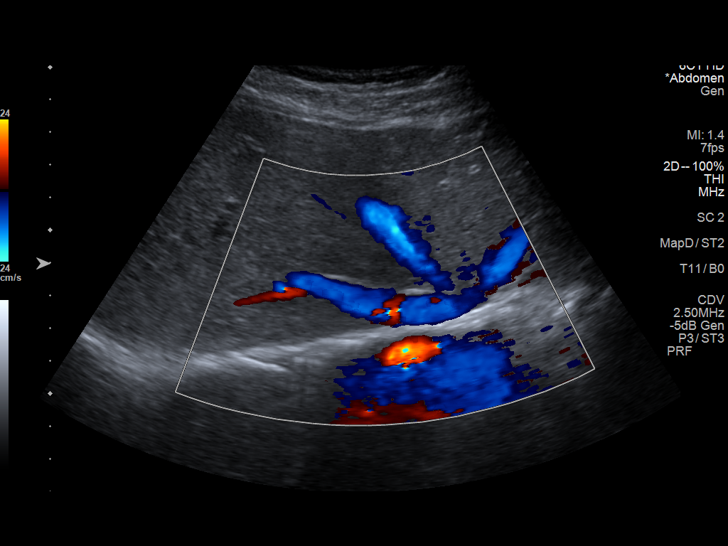
[im 55/55]
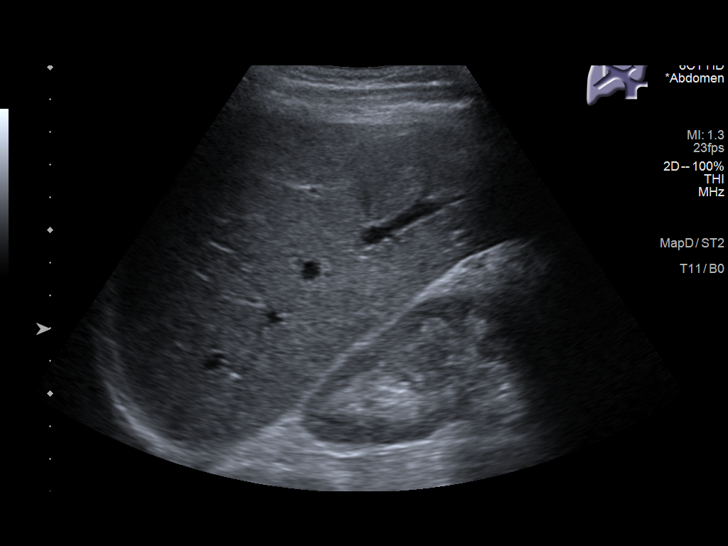

[14 of 25 positions shown; findings below may reference images not displayed]

FINDINGS: Gallbladder:

No gallstones or wall thickening visualized. No sonographic Murphy
sign noted by sonographer.

Common bile duct:

Diameter: 3.5 mm

Liver:

No focal lesion identified. Within normal limits in parenchymal
echogenicity. Portal vein is patent on color Doppler imaging with
normal direction of blood flow towards the liver.

Other: None.
IMPRESSION: Unremarkable right upper quadrant ultrasound.
